# Patient Record
Sex: Female | Born: 1960 | Race: White | Hispanic: No | Marital: Married | Smoking: Current every day smoker
Health system: Southern US, Community
[De-identification: ages and names within clinical notes are randomized; demographics above are authoritative.]

## PROBLEM LIST (undated history)

## (undated) DIAGNOSIS — J449 Chronic obstructive pulmonary disease, unspecified: Secondary | ICD-10-CM

## (undated) DIAGNOSIS — I10 Essential (primary) hypertension: Secondary | ICD-10-CM

## (undated) HISTORY — PX: TUBAL LIGATION: SHX77

## (undated) HISTORY — PX: HERNIA REPAIR: SHX51

## (undated) HISTORY — PX: BREAST SURGERY: SHX581

---

## 2011-05-05 ENCOUNTER — Emergency Department: Payer: Self-pay | Admitting: Emergency Medicine

## 2012-12-24 ENCOUNTER — Emergency Department (HOSPITAL_COMMUNITY): Payer: No Typology Code available for payment source

## 2012-12-24 ENCOUNTER — Encounter (HOSPITAL_COMMUNITY): Payer: Self-pay | Admitting: *Deleted

## 2012-12-24 ENCOUNTER — Emergency Department (HOSPITAL_COMMUNITY)
Admission: EM | Admit: 2012-12-24 | Discharge: 2012-12-24 | Disposition: A | Discharge status: Home / Self Care | Payer: No Typology Code available for payment source | Attending: Emergency Medicine | Admitting: Emergency Medicine

## 2012-12-24 DIAGNOSIS — S39012A Strain of muscle, fascia and tendon of lower back, initial encounter: Secondary | ICD-10-CM

## 2012-12-24 DIAGNOSIS — S161XXA Strain of muscle, fascia and tendon at neck level, initial encounter: Secondary | ICD-10-CM

## 2012-12-24 DIAGNOSIS — Y92410 Unspecified street and highway as the place of occurrence of the external cause: Secondary | ICD-10-CM | POA: Insufficient documentation

## 2012-12-24 DIAGNOSIS — Z9851 Tubal ligation status: Secondary | ICD-10-CM | POA: Insufficient documentation

## 2012-12-24 DIAGNOSIS — S139XXA Sprain of joints and ligaments of unspecified parts of neck, initial encounter: Secondary | ICD-10-CM | POA: Insufficient documentation

## 2012-12-24 DIAGNOSIS — F17200 Nicotine dependence, unspecified, uncomplicated: Secondary | ICD-10-CM | POA: Insufficient documentation

## 2012-12-24 DIAGNOSIS — Y9389 Activity, other specified: Secondary | ICD-10-CM | POA: Insufficient documentation

## 2012-12-24 DIAGNOSIS — S335XXA Sprain of ligaments of lumbar spine, initial encounter: Secondary | ICD-10-CM | POA: Insufficient documentation

## 2012-12-24 DIAGNOSIS — Z79899 Other long term (current) drug therapy: Secondary | ICD-10-CM | POA: Insufficient documentation

## 2012-12-24 MED ORDER — HYDROCODONE-ACETAMINOPHEN 5-325 MG PO TABS: 1.0000 | ORAL_TABLET | Freq: Four times a day (QID) | ORAL | Status: AC | PRN

## 2012-12-24 MED ORDER — HYDROCODONE-ACETAMINOPHEN 5-325 MG PO TABS
1.0000 | ORAL_TABLET | Freq: Once | ORAL | Status: AC
  Administered 2012-12-24: 1 via ORAL
  Filled 2012-12-24: qty 1

## 2012-12-24 MED ORDER — CYCLOBENZAPRINE HCL 10 MG PO TABS
10.0000 mg | ORAL_TABLET | Freq: Once | ORAL | Status: AC
  Administered 2012-12-24: 10 mg via ORAL
  Filled 2012-12-24: qty 1

## 2012-12-24 MED ORDER — CYCLOBENZAPRINE HCL 10 MG PO TABS: ORAL_TABLET | ORAL | Status: DC
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
## 2012-12-25 NOTE — ED Provider Notes (Signed)
Medical screening examination/treatment/procedure(s) were performed by non-physician practitioner and as supervising physician I was immediately available for consultation/collaboration. Samuell Knoble, MD, FACEP   Thersea Manfredonia L Yesli Vanderhoff, MD 12/25/12 0028 

## 2013-01-14 ENCOUNTER — Telehealth: Payer: Self-pay | Admitting: Orthopedic Surgery
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
## 2020-06-01 ENCOUNTER — Other Ambulatory Visit: Payer: Self-pay

## 2020-06-01 ENCOUNTER — Emergency Department (HOSPITAL_COMMUNITY): Payer: Self-pay

## 2020-06-01 ENCOUNTER — Encounter (HOSPITAL_COMMUNITY): Payer: Self-pay

## 2020-06-01 ENCOUNTER — Emergency Department (HOSPITAL_COMMUNITY)
Admission: EM | Admit: 2020-06-01 | Discharge: 2020-06-01 | Disposition: A | Discharge status: Home / Self Care | Payer: Self-pay | Attending: Emergency Medicine | Admitting: Emergency Medicine

## 2020-06-01 DIAGNOSIS — N644 Mastodynia: Secondary | ICD-10-CM | POA: Insufficient documentation

## 2020-06-01 DIAGNOSIS — F17210 Nicotine dependence, cigarettes, uncomplicated: Secondary | ICD-10-CM | POA: Insufficient documentation

## 2020-06-01 MED ORDER — ACETAMINOPHEN 500 MG PO TABS
1000.0000 mg | ORAL_TABLET | Freq: Once | ORAL | Status: AC
  Administered 2020-06-01: 1000 mg via ORAL
  Filled 2020-06-01: qty 2

## 2020-06-01 MED ORDER — KETOROLAC TROMETHAMINE 60 MG/2ML IM SOLN
60.0000 mg | Freq: Once | INTRAMUSCULAR | Status: AC
  Administered 2020-06-01: 60 mg via INTRAMUSCULAR
  Filled 2020-06-01: qty 2

## 2020-06-01 MED ORDER — NAPROXEN 375 MG PO TABS: 375.0000 mg | ORAL_TABLET | Freq: Two times a day (BID) | ORAL | 0 refills | Status: DC

## 2020-06-01 NOTE — ED Triage Notes (Signed)
Pt presents to ED with complaints of right breast pain. Pt states she had augmentation surgery on that breast about 28 years ago and yesterday she felt something pop in the right breast.

## 2020-06-01 NOTE — ED Provider Notes (Signed)
[facility] Provider Note   CSN: [id number] Arrival date & time: 06/01/20  2542     History Chief Complaint  Patient presents with  . Breast Pain    Stephanie Nielsen is a 59 y.o. female.  Patient with history of breast augmentation 28 years ago, cigarette smoker presents with breast discomfort where she felt something pop in the right breast and now has pain with palpation or movement.  Patient denies shortness of breath, no blood clot risk factors, no fevers or infectious symptoms.        History reviewed. No pertinent past medical history.  There are no problems to display for this patient.   Past Surgical History:  Procedure Laterality Date  . BREAST SURGERY    . HERNIA REPAIR    . TUBAL LIGATION       OB History   No obstetric history on file.     No family history on file.  Social History   Tobacco Use  . Smoking status: Current Every Day Smoker    Types: Cigarettes  . Smokeless tobacco: Never Used  Substance Use Topics  . Alcohol use: Yes  . Drug use: No    Home Medications Prior to Admission medications   Medication Sig Start Date End Date Taking? Authorizing Provider  albuterol (PROVENTIL HFA;VENTOLIN HFA) 108 (90 BASE) MCG/ACT inhaler Inhale 2 puffs into the lungs every 6 (six) hours as needed.    [provider]  cyclobenzaprine (FLEXERIL) 10 MG tablet 1/2 to one  Tab po TID 12/24/12   Duaine Dredge, PA-C  naproxen (NAPROSYN) 375 MG tablet Take 1 tablet (375 mg total) by mouth 2 (two) times daily with a meal. 06/01/20   Elnora Morrison, MD    Allergies    Bee venom  Review of Systems   Review of Systems  Constitutional: Negative for chills and fever.  HENT: Negative for congestion.   Eyes: Negative for visual disturbance.  Respiratory: Negative for shortness of breath.   Cardiovascular: Negative for chest pain.  Gastrointestinal: Negative for abdominal pain and vomiting.  Genitourinary: Negative for dysuria  and flank pain.  Musculoskeletal: Negative for back pain, neck pain and neck stiffness.  Skin: Negative for rash.  Neurological: Negative for light-headedness and headaches.    Physical Exam Updated Vital Signs BP (!) 169/90 (BP Location: Right Arm)   Pulse 89   Resp 18   Ht 5\' 1"  (1.549 m)   Wt 59 kg   SpO2 100%   BMI 24.56 kg/m   Physical Exam Vitals and nursing note reviewed.  Constitutional:      Appearance: She is well-developed.  HENT:     Head: Normocephalic and atraumatic.  Eyes:     General:        Right eye: No discharge.        Left eye: No discharge.     Conjunctiva/sclera: Conjunctivae normal.  Neck:     Trachea: No tracheal deviation.  Cardiovascular:     Rate and Rhythm: Normal rate and regular rhythm.  Pulmonary:     Effort: Pulmonary effort is normal.     Breath sounds: Normal breath sounds.  Abdominal:     General: There is no distension.     Palpations: Abdomen is soft.     Tenderness: There is no abdominal tenderness. There is no guarding.  Musculoskeletal:     Cervical back: Normal range of motion and neck supple.  Skin:    General: Skin is  warm.     Findings: No rash.     Comments: No mass palpated right breast, tenderness base of right breast without sign of infection, reproducible tenderness, no external sign infection no drainage from right nipple.  Neurological:     Mental Status: She is alert and oriented to person, place, and time.     ED Results / Procedures / Treatments   Labs (all labs ordered are listed, but only abnormal results are displayed) Labs Reviewed - No data to display  EKG None  Radiology DG Chest 2 View  Result Date: 06/01/2020 CLINICAL DATA:  Pt with Hx of breast augmentation c/o pain in Rt chest/breast and Rt axilla after feeling a pop in Rt chest while vacuuming. Hx smoker, no COVID test ordered EXAM: CHEST - 2 VIEW COMPARISON:  Chest radiograph 10/13/2011 FINDINGS: The heart size and mediastinal contours are  within normal limits. Tiny linear opacity in the lateral right mid lung likely atelectasis or scarring. Lungs are otherwise clear. No pneumothorax or pleural effusion. The visualized skeletal structures are unremarkable. IMPRESSION: No acute cardiopulmonary finding. Electronically Signed   By: Emmaline Kluver M.D.   On: 06/01/2020 10:34    Procedures Procedures (including critical care time)  Medications Ordered in ED Medications  acetaminophen (TYLENOL) tablet 1,000 mg (1,000 mg Oral Given 06/01/20 0958)  ketorolac (TORADOL) injection 60 mg (60 mg Intramuscular Given 06/01/20 1041)    ED Course  I have reviewed the triage vital signs and the nursing notes.  Pertinent labs & imaging results that were available during my care of the patient were reviewed by me and considered in my medical decision making (see chart for details).    MDM Rules/Calculators/A&P                          Patient presents with right breast pain, female nurse in the room for assessment/exam.  Pain meds ordered and x-ray pending.  Pain meds given in the ER Tylenol and Toradol.  Exam consistent with muscular/rib, no evidence of mass or abscess however for further details patient needs outpatient mammogram/reassessment.  Discussed the importance of this with the patient. X-ray reviewed no acute fractures, no acute abnormalities.  Final Clinical Impression(s) / ED Diagnoses Final diagnoses:  Breast pain, right    Rx / DC Orders ED Discharge Orders         Ordered    naproxen (NAPROSYN) 375 MG tablet  2 times daily with meals     Discontinue  Reprint     06/01/20 1104           Blane Ohara, MD 06/01/20 1105

## 2020-06-01 NOTE — ED Notes (Signed)
Pt frustrated at diagnosis of breast pain. Pt stated "I don't get what no acute findings mean on my x-ray and why you can't tell me why I am having this sharp pain in my breast." Explained to pt that the ER may not be able to find a diagnosis but ruled out several emergency diagnoses. Instructed pt to follow up with PCP and schedule mammogram.

## 2020-06-01 NOTE — Discharge Instructions (Addendum)
Follow up with primary doctor as you will likely need ultrasound of breast/ mammogram for more details. Return for signs of infection, shortness of breath or new concerns. Take tylenol every 4 hours as needed for pain, take naproxen as needed and directed for further pain.

## 2021-02-23 IMAGING — DX DG CHEST 2V
2 series · 2 of 2 positions shown · non-contrast
Comparison: Chest radiograph 10/13/2011

CLINICAL DATA: Pt with Hx of breast augmentation c/o pain in Rt
chest/breast and Rt axilla after feeling a pop in Rt chest while
vacuuming. Hx smoker, no COVID test ordered

EXAM:
CHEST - 2 VIEW

[chest pa]
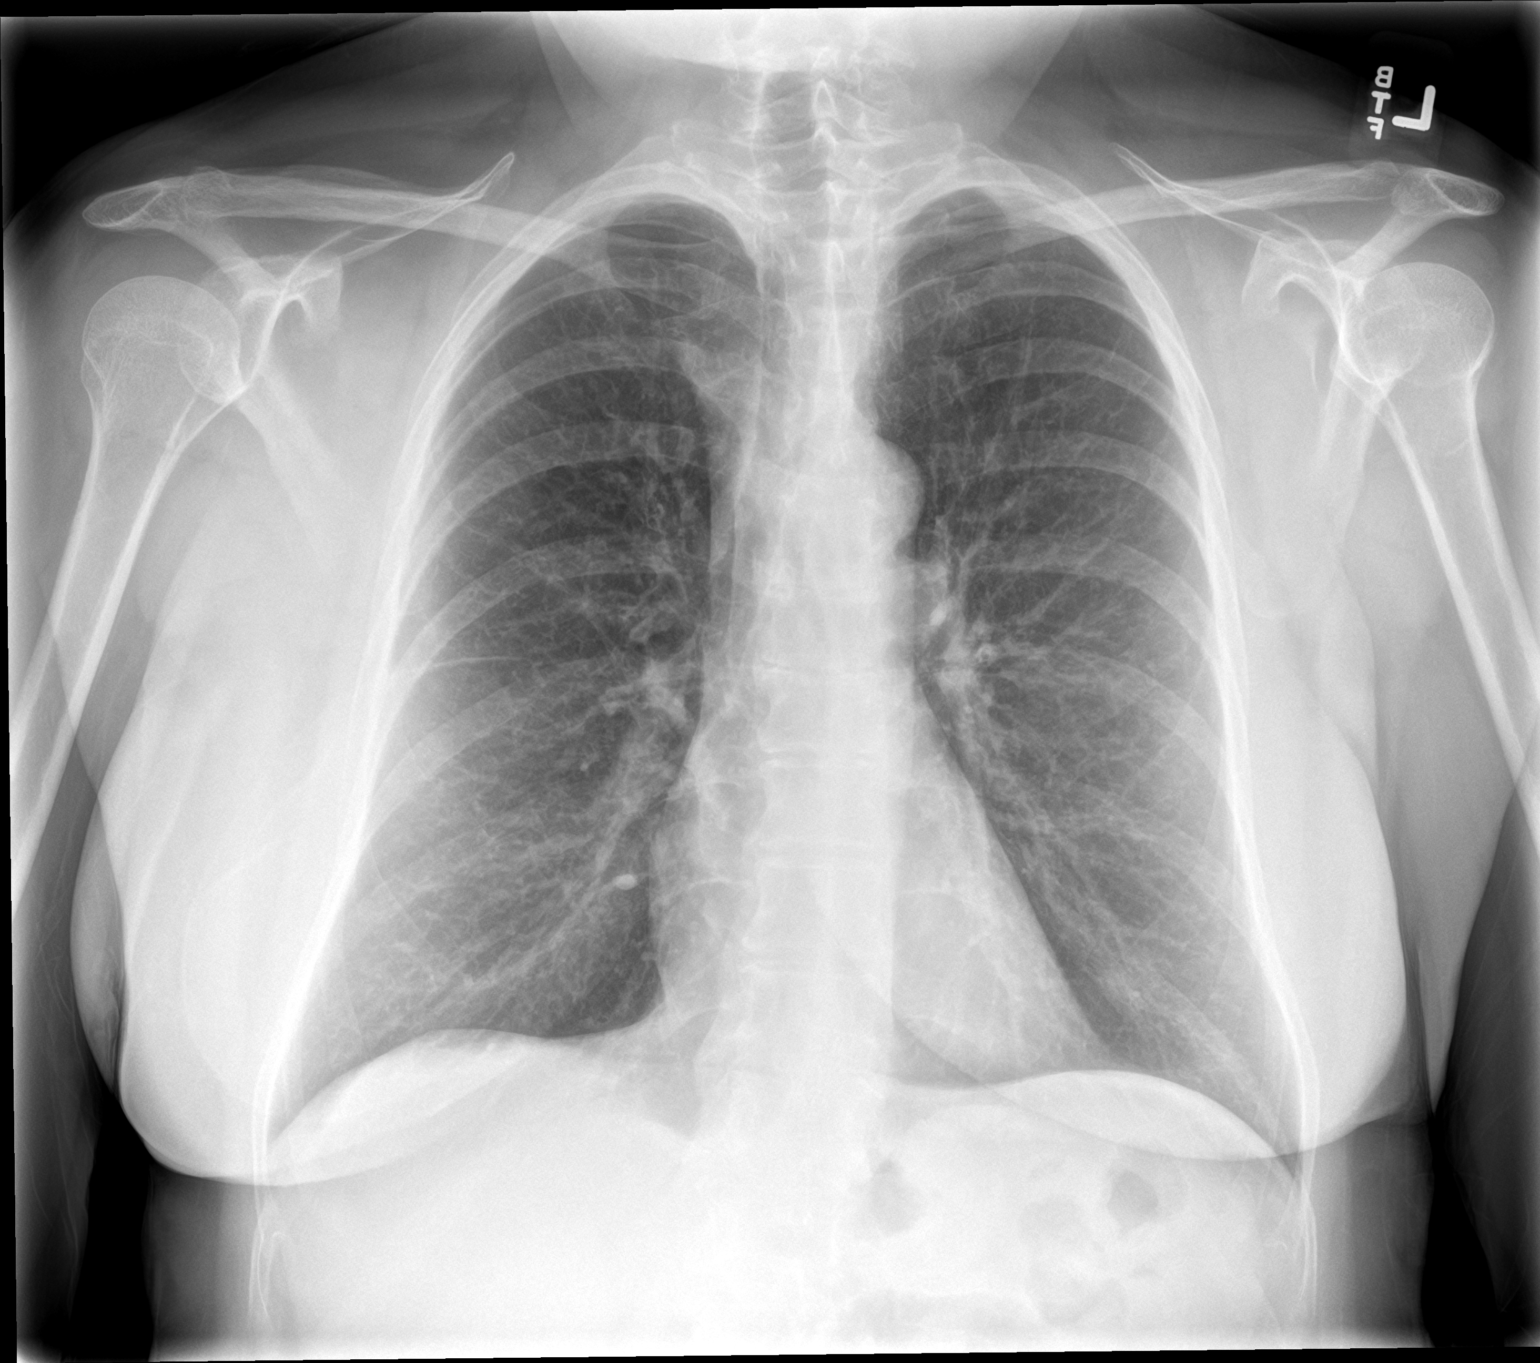

[chest lat]
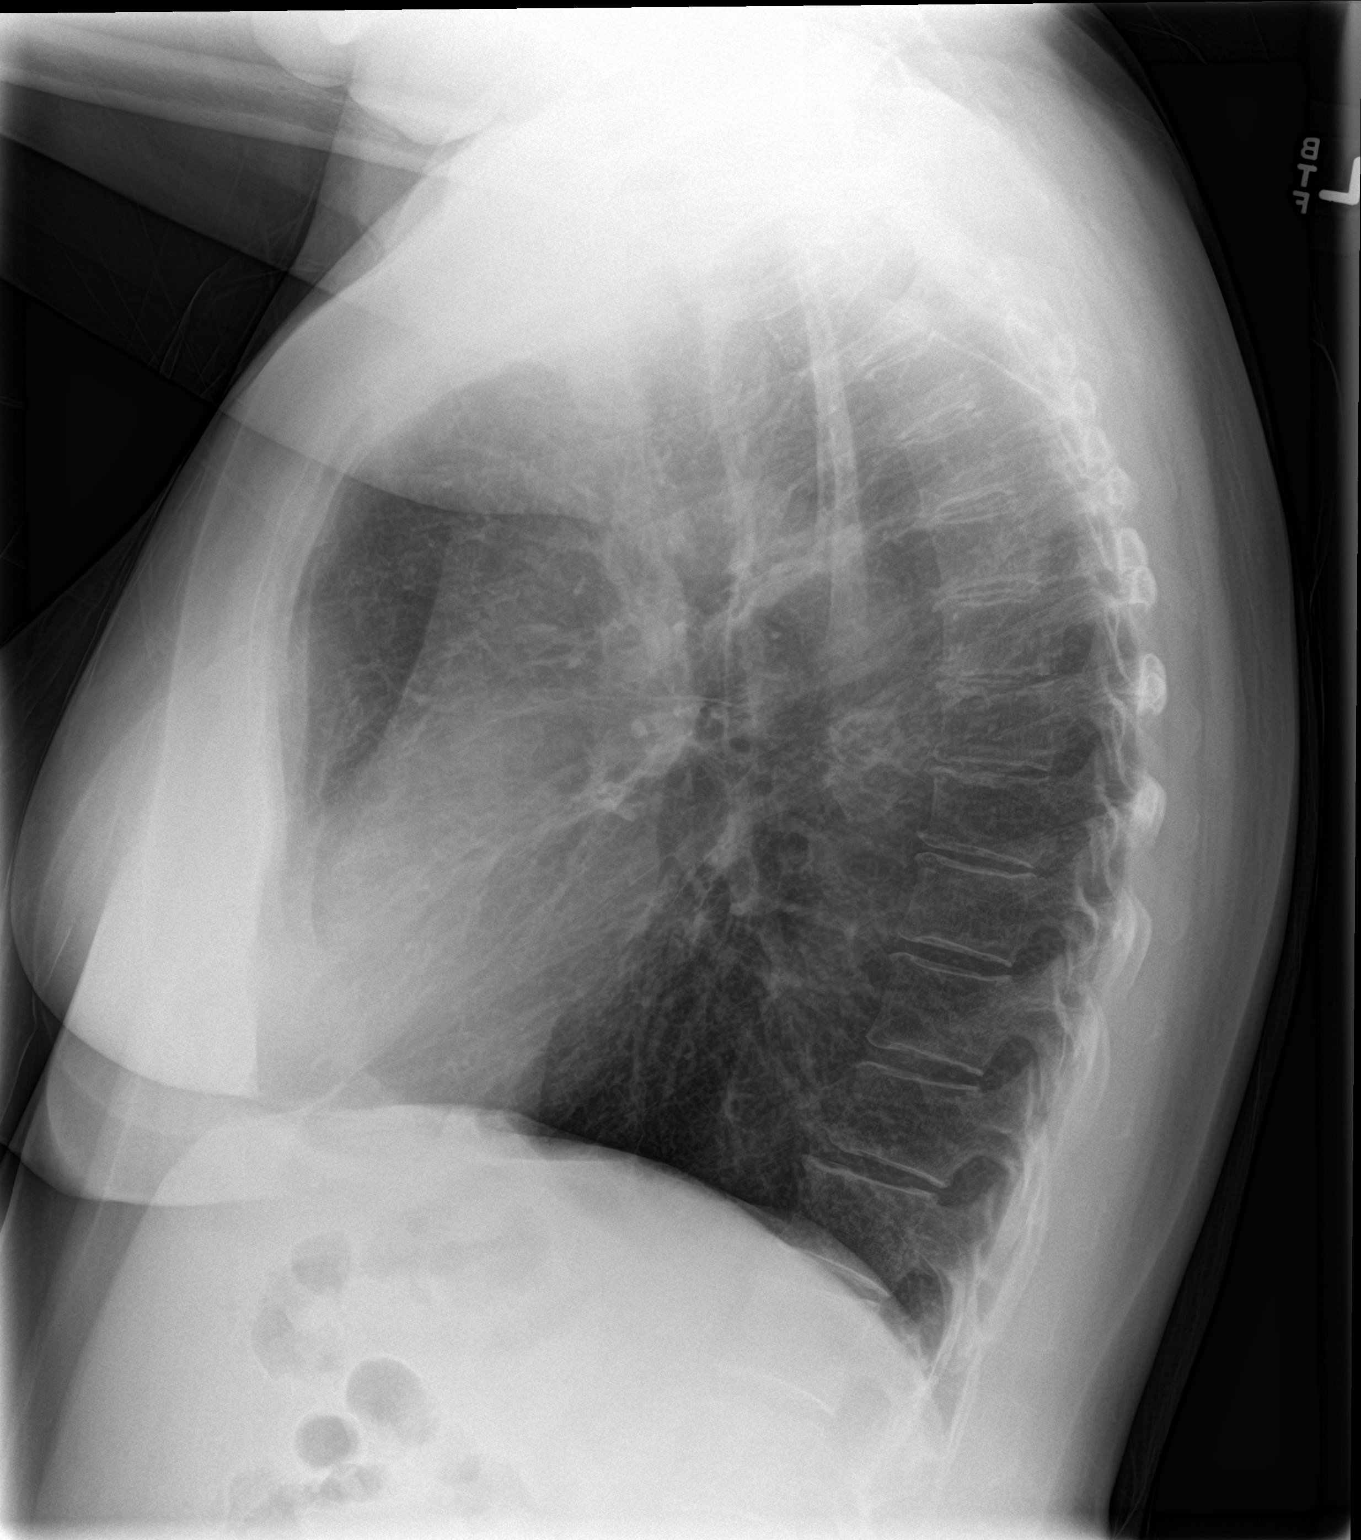

[2 of 2 positions shown; findings below may reference images not displayed]

FINDINGS: The heart size and mediastinal contours are within normal limits.
Tiny linear opacity in the lateral right mid lung likely atelectasis
or scarring. Lungs are otherwise clear. No pneumothorax or pleural
effusion. The visualized skeletal structures are unremarkable.
IMPRESSION: No acute cardiopulmonary finding.

## 2022-06-13 ENCOUNTER — Emergency Department (HOSPITAL_COMMUNITY): Payer: Self-pay

## 2022-06-13 ENCOUNTER — Emergency Department (HOSPITAL_COMMUNITY)
Admission: EM | Admit: 2022-06-13 | Discharge: 2022-06-13 | Disposition: A | Discharge status: Home / Self Care | Payer: Self-pay | Attending: Emergency Medicine | Admitting: Emergency Medicine

## 2022-06-13 ENCOUNTER — Other Ambulatory Visit: Payer: Self-pay

## 2022-06-13 ENCOUNTER — Encounter (HOSPITAL_COMMUNITY): Payer: Self-pay | Admitting: Emergency Medicine

## 2022-06-13 DIAGNOSIS — I10 Essential (primary) hypertension: Secondary | ICD-10-CM | POA: Insufficient documentation

## 2022-06-13 DIAGNOSIS — G8929 Other chronic pain: Secondary | ICD-10-CM | POA: Insufficient documentation

## 2022-06-13 DIAGNOSIS — M25511 Pain in right shoulder: Secondary | ICD-10-CM | POA: Insufficient documentation

## 2022-06-13 DIAGNOSIS — R200 Anesthesia of skin: Secondary | ICD-10-CM | POA: Insufficient documentation

## 2022-06-13 DIAGNOSIS — D72829 Elevated white blood cell count, unspecified: Secondary | ICD-10-CM | POA: Insufficient documentation

## 2022-06-13 DIAGNOSIS — M25512 Pain in left shoulder: Secondary | ICD-10-CM | POA: Insufficient documentation

## 2022-06-13 HISTORY — DX: Essential (primary) hypertension: I10

## 2022-06-13 LAB — CBC WITH DIFFERENTIAL/PLATELET
Abs Immature Granulocytes: 0.07 10*3/uL (ref 0.00–0.07)
Basophils Absolute: 0.1 10*3/uL (ref 0.0–0.1)
Basophils Relative: 1 %
Eosinophils Absolute: 0.2 10*3/uL (ref 0.0–0.5)
Eosinophils Relative: 2 %
HCT: 45.7 % (ref 36.0–46.0)
Hemoglobin: 14.7 g/dL (ref 12.0–15.0)
Immature Granulocytes: 1 %
Lymphocytes Relative: 17 %
Lymphs Abs: 2.4 10*3/uL (ref 0.7–4.0)
MCH: 30 pg (ref 26.0–34.0)
MCHC: 32.2 g/dL (ref 30.0–36.0)
MCV: 93.3 fL (ref 80.0–100.0)
Monocytes Absolute: 0.9 10*3/uL (ref 0.1–1.0)
Monocytes Relative: 6 %
Neutro Abs: 10.5 10*3/uL — ABNORMAL HIGH (ref 1.7–7.7)
Neutrophils Relative %: 73 %
Platelets: 334 10*3/uL (ref 150–400)
RBC: 4.9 MIL/uL (ref 3.87–5.11)
RDW: 14.4 % (ref 11.5–15.5)
WBC: 14.1 10*3/uL — ABNORMAL HIGH (ref 4.0–10.5)
nRBC: 0 % (ref 0.0–0.2)

## 2022-06-13 LAB — URINALYSIS, ROUTINE W REFLEX MICROSCOPIC
Bacteria, UA: NONE SEEN
Bilirubin Urine: NEGATIVE
Glucose, UA: NEGATIVE mg/dL
Ketones, ur: NEGATIVE mg/dL
Leukocytes,Ua: NEGATIVE
Nitrite: NEGATIVE
Protein, ur: NEGATIVE mg/dL
Specific Gravity, Urine: 1.016 (ref 1.005–1.030)
pH: 5 (ref 5.0–8.0)

## 2022-06-13 LAB — BASIC METABOLIC PANEL
Anion gap: 10 (ref 5–15)
BUN: 10 mg/dL (ref 6–20)
CO2: 26 mmol/L (ref 22–32)
Calcium: 9.4 mg/dL (ref 8.9–10.3)
Chloride: 100 mmol/L (ref 98–111)
Creatinine, Ser: 0.8 mg/dL (ref 0.44–1.00)
GFR, Estimated: >60 mL/min (ref >60)
Glucose, Bld: 92 mg/dL (ref 70–99)
Potassium: 4.3 mmol/L (ref 3.5–5.1)
Sodium: 136 mmol/L (ref 135–145)

## 2022-06-13 LAB — RAPID URINE DRUG SCREEN, HOSP PERFORMED
Amphetamines: NOT DETECTED
Barbiturates: NOT DETECTED
Benzodiazepines: NOT DETECTED
Cocaine: NOT DETECTED
Opiates: NOT DETECTED
Tetrahydrocannabinol: POSITIVE — AB

## 2022-06-13 LAB — PROTIME-INR
INR: 0.9 (ref 0.8–1.2)
Prothrombin Time: 12.3 seconds (ref 11.4–15.2)

## 2022-06-13 LAB — TROPONIN I (HIGH SENSITIVITY)
Troponin I (High Sensitivity): 3 ng/L (ref <18)
Troponin I (High Sensitivity): 3 ng/L (ref <18)

## 2022-06-13 MED ORDER — PREDNISONE 10 MG PO TABS: ORAL_TABLET | ORAL | 0 refills | Status: DC

## 2022-06-13 MED ORDER — PREDNISONE 50 MG PO TABS
60.0000 mg | ORAL_TABLET | Freq: Once | ORAL | Status: AC
  Administered 2022-06-13: 60 mg via ORAL
  Filled 2022-06-13: qty 1

## 2022-06-13 MED ORDER — HYDROCODONE-ACETAMINOPHEN 5-325 MG PO TABS: 1.0000 | ORAL_TABLET | Freq: Four times a day (QID) | ORAL | 0 refills | Status: DC | PRN

## 2022-06-13 MED ORDER — HYDROCODONE-ACETAMINOPHEN 5-325 MG PO TABS
1.0000 | ORAL_TABLET | Freq: Once | ORAL | Status: AC
  Administered 2022-06-13: 1 via ORAL
  Filled 2022-06-13: qty 1

## 2022-06-13 NOTE — ED Provider Notes (Signed)
[facility]Y [facility]MENT Provider Note   CSN: [id number] Arrival date & time: 06/13/22  0931     History  Chief Complaint  Patient presents with   Hand Problem    Stephanie Nielsen is a 61 y.o. female with history of hypertension, surgical history significant for right carpal tunnel surgery presenting for evaluation of intermittent episodes of numbness.  She describes chronic intermittent numbness predominantly in her left fourth and fifth fingers and lateral hand which has been present for more than 6 months but has been more frequent over the past several weeks.  She also describes having an episode 2 days ago when she was eating breakfast where the left side of her face went numb from her cheek through her chin, lasted for about an hour and then resolved.  She denies any focal weakness, dizziness, headache, neck pain or stiffness, difficulty with speech.  She currently feels slight tingling sensation in her left fourth and fifth fingers, not as severe as is typical.  She frequently wakes with a sensation in her left hand.  She has found no alleviators for this symptom, stating it occurs randomly.  The history is provided by the patient.       Home Medications Prior to Admission medications   Medication Sig Start Date End Date Taking? Authorizing Provider  HYDROcodone-acetaminophen (NORCO/VICODIN) 5-325 MG tablet Take 1 tablet by mouth every 6 (six) hours as needed for severe pain. 06/13/22  Yes Rishawn Walck, Raynelle FanningJulie, PA-C  predniSONE (DELTASONE) 10 MG tablet 6, 5, 4, 3, 2 then 1 tablet by mouth daily for 6 days total. 06/13/22  Yes Lanice Folden, Raynelle FanningJulie, PA-C  albuterol (PROVENTIL HFA;VENTOLIN HFA) 108 (90 BASE) MCG/ACT inhaler Inhale 2 puffs into the lungs every 6 (six) hours as needed.    [provider]  cyclobenzaprine (FLEXERIL) 10 MG tablet 1/2 to one  Tab po TID 12/24/12   Worthy RancherMiller, Richard M, PA-C  naproxen (NAPROSYN) 375 MG tablet Take 1 tablet (375 mg total) by mouth 2 (two) times daily  with a meal. 06/01/20   Blane OharaZavitz, Joshua, MD      Allergies    Bee venom    Review of Systems   Review of Systems  Constitutional:  Negative for chills and fever.  HENT: Negative.    Eyes: Negative.   Respiratory:  Negative for chest tightness and shortness of breath.   Cardiovascular: Negative.   Gastrointestinal: Negative.   Genitourinary: Negative.   Musculoskeletal:  Negative for arthralgias, joint swelling, myalgias and neck pain.  Skin: Negative.  Negative for rash and wound.  Neurological:  Positive for numbness. Negative for dizziness, facial asymmetry, speech difficulty, weakness, light-headedness and headaches.  Psychiatric/Behavioral: Negative.      Physical Exam Updated Vital Signs BP (!) 166/92   Pulse 67   Temp 98.3 F (36.8 C) (Oral)   Resp 14   Ht 5\' 1"  (1.549 m)   Wt 59 kg   SpO2 98%   BMI 24.58 kg/m  Physical Exam Vitals and nursing note reviewed.  Constitutional:      Appearance: She is well-developed.  HENT:     Head: Normocephalic and atraumatic.  Eyes:     General: No visual field deficit.    Conjunctiva/sclera: Conjunctivae normal.  Cardiovascular:     Rate and Rhythm: Normal rate and regular rhythm.     Heart sounds: Normal heart sounds.  Pulmonary:     Effort: Pulmonary effort is normal.     Breath sounds: Normal breath sounds. No  wheezing.  Abdominal:     General: Bowel sounds are normal.     Palpations: Abdomen is soft.     Tenderness: There is no abdominal tenderness.  Musculoskeletal:        General: Normal range of motion.     Cervical back: Normal range of motion.     Comments: Positive Tinel's left.  Patient endorses tingling in her left ring, fifth and less involved long finger.  Skin:    General: Skin is warm and dry.  Neurological:     General: No focal deficit present.     Mental Status: She is alert.     Cranial Nerves: Cranial nerves 2-12 are intact. No dysarthria or facial asymmetry.     Sensory: Sensory deficit  present.     Motor: Motor function is intact.     Coordination: Coordination is intact. Coordination normal. Rapid alternating movements normal.     Gait: Gait normal.     Deep Tendon Reflexes: Reflexes are normal and symmetric.     Comments: Left fingers, ulnar distribution.  Negative pronator drift     ED Results / Procedures / Treatments   Labs (all labs ordered are listed, but only abnormal results are displayed) Labs Reviewed  CBC WITH DIFFERENTIAL/PLATELET - Abnormal; Notable for the following components:      Result Value   WBC 14.1 (*)    Neutro Abs 10.5 (*)    All other components within normal limits  RAPID URINE DRUG SCREEN, HOSP PERFORMED - Abnormal; Notable for the following components:   Tetrahydrocannabinol POSITIVE (*)    All other components within normal limits  URINALYSIS, ROUTINE W REFLEX MICROSCOPIC - Abnormal; Notable for the following components:   Hgb urine dipstick MODERATE (*)    All other components within normal limits  BASIC METABOLIC PANEL  PROTIME-INR  TROPONIN I (HIGH SENSITIVITY)  TROPONIN I (HIGH SENSITIVITY)    EKG None  Radiology MR BRAIN WO CONTRAST  Result Date: 06/13/2022 CLINICAL DATA:  Provided history: Transient ischemic attack (TIA). Intermittent left face and left hand numbness. EXAM: MRI HEAD WITHOUT CONTRAST MRI CERVICAL SPINE WITHOUT CONTRAST TECHNIQUE: Multiplanar, multiecho pulse sequences of the brain and surrounding structures, and cervical spine, to include the craniocervical junction and cervicothoracic junction, were obtained without intravenous contrast. COMPARISON:  Cervical spine CT 12/21/2012. FINDINGS: MRI HEAD FINDINGS Mild intermittent motion degradation. Brain: Cerebral volume is normal. No cortical encephalomalacia is identified. No significant cerebral white matter disease for age. There is no acute infarct. No evidence of an intracranial mass. No chronic intracranial blood products. No extra-axial fluid collection.  No midline shift. Vascular: Maintained flow voids within the proximal large arterial vessels. Skull and upper cervical spine: No focal suspicious marrow lesion. Sinuses/Orbits: No mass or acute finding within the imaged orbits. Mild mucosal thickening along the floor of the left maxillary sinus. Asymmetric soft tissue prominence along the left middle nasal turbinate, which may reflect turbinate hypertrophy or a polyp (for instance as seen on series 10, image 3). Other: Small volume fluid within the bilateral mastoid air cells. MRI CERVICAL SPINE FINDINGS Mild intermittent motion degradation Alignment: Straightening of the expected cervical lordosis. Trace C2-C3 grade 1 anterolisthesis. Trace C5-C6 grade 1 retrolisthesis. Vertebrae: Vertebral body height is maintained. Trace degenerative endplate edema at I4-P8. No significant marrow edema or focal suspicious osseous lesion identified elsewhere. Cord: No signal abnormality identified within the cervical spinal cord. Posterior Fossa, vertebral arteries, paraspinal tissues: Posterior fossa better assessed on concurrently performed brain MRI.  Flow voids preserved within the imaged cervical vertebral arteries. No paraspinal mass or collection. Disc levels: Mild-to-moderate disc degeneration at C5-C6. No more than mild disc degeneration at the remaining levels. C2-C3: Trace grade 1 anterolisthesis. Facet arthrosis on the left. No significant spinal canal stenosis. Mild left neural foraminal narrowing. C3-C4: Shallow disc bulge. Uncovertebral hypertrophy on the right. No significant spinal canal stenosis. Mild relative right neural foraminal narrowing. C4-C5: Disc bulge with endplate spurring. Uncovertebral hypertrophy on the right. Facet arthrosis on the left. No significant spinal canal stenosis. Bilateral neural foraminal narrowing (mild right, mild to moderate left). C5-C6: Posterior disc osteophyte complex with right-sided disc osteophyte ridge/uncinate hypertrophy.  Uncovertebral hypertrophy also present on the left. Mild facet arthrosis. Mild relative spinal canal narrowing (without spinal cord mass effect). Bilateral neural foraminal narrowing (severe right, moderate left). C6-C7: Shallow disc bulge. Uncovertebral hypertrophy on the right. Mild facet arthrosis. Mild relative spinal canal narrowing (without spinal cord mass effect). Bilateral neural foraminal narrowing (severe right, moderate left). C7-T1: Mild facet arthrosis. No significant disc herniation or stenosis. IMPRESSION: MRI brain: 1. Unremarkable non-contrast MRI appearance of the brain for age. No evidence of acute intracranial abnormality. 2. Mild left maxillary sinus mucosal thickening. 3. Asymmetric soft tissue prominence along the left middle nasal turbinate, which may reflect turbinate hypertrophy or a polyp. Direct visualization recommended. 4. Small-volume fluid within the bilateral mastoid air cells. MRI brain: 1. Mildly motion degraded exam. 2. Cervical spondylosis, as outlined. No more than mild relative spinal canal narrowing. Multilevel foraminal stenosis, as detailed and greatest on the left at C4-C5 (mild-to-moderate), bilaterally at C5-C6 (severe right, moderate left) and bilaterally at C6-C7 (severe right, moderate left). Disc degeneration is greatest at C5-C6 (moderate at this level with associated trace degenerative endplate edema). 3. Nonspecific straightening of the expected cervical lordosis. Electronically Signed   By: Jackey Loge D.O.   On: 06/13/2022 12:14   MR Cervical Spine Wo Contrast  Result Date: 06/13/2022 CLINICAL DATA:  Provided history: Transient ischemic attack (TIA). Intermittent left face and left hand numbness. EXAM: MRI HEAD WITHOUT CONTRAST MRI CERVICAL SPINE WITHOUT CONTRAST TECHNIQUE: Multiplanar, multiecho pulse sequences of the brain and surrounding structures, and cervical spine, to include the craniocervical junction and cervicothoracic junction, were obtained  without intravenous contrast. COMPARISON:  Cervical spine CT 12/21/2012. FINDINGS: MRI HEAD FINDINGS Mild intermittent motion degradation. Brain: Cerebral volume is normal. No cortical encephalomalacia is identified. No significant cerebral white matter disease for age. There is no acute infarct. No evidence of an intracranial mass. No chronic intracranial blood products. No extra-axial fluid collection. No midline shift. Vascular: Maintained flow voids within the proximal large arterial vessels. Skull and upper cervical spine: No focal suspicious marrow lesion. Sinuses/Orbits: No mass or acute finding within the imaged orbits. Mild mucosal thickening along the floor of the left maxillary sinus. Asymmetric soft tissue prominence along the left middle nasal turbinate, which may reflect turbinate hypertrophy or a polyp (for instance as seen on series 10, image 3). Other: Small volume fluid within the bilateral mastoid air cells. MRI CERVICAL SPINE FINDINGS Mild intermittent motion degradation Alignment: Straightening of the expected cervical lordosis. Trace C2-C3 grade 1 anterolisthesis. Trace C5-C6 grade 1 retrolisthesis. Vertebrae: Vertebral body height is maintained. Trace degenerative endplate edema at K3-T4. No significant marrow edema or focal suspicious osseous lesion identified elsewhere. Cord: No signal abnormality identified within the cervical spinal cord. Posterior Fossa, vertebral arteries, paraspinal tissues: Posterior fossa better assessed on concurrently performed brain MRI. Flow voids preserved within  the imaged cervical vertebral arteries. No paraspinal mass or collection. Disc levels: Mild-to-moderate disc degeneration at C5-C6. No more than mild disc degeneration at the remaining levels. C2-C3: Trace grade 1 anterolisthesis. Facet arthrosis on the left. No significant spinal canal stenosis. Mild left neural foraminal narrowing. C3-C4: Shallow disc bulge. Uncovertebral hypertrophy on the right. No  significant spinal canal stenosis. Mild relative right neural foraminal narrowing. C4-C5: Disc bulge with endplate spurring. Uncovertebral hypertrophy on the right. Facet arthrosis on the left. No significant spinal canal stenosis. Bilateral neural foraminal narrowing (mild right, mild to moderate left). C5-C6: Posterior disc osteophyte complex with right-sided disc osteophyte ridge/uncinate hypertrophy. Uncovertebral hypertrophy also present on the left. Mild facet arthrosis. Mild relative spinal canal narrowing (without spinal cord mass effect). Bilateral neural foraminal narrowing (severe right, moderate left). C6-C7: Shallow disc bulge. Uncovertebral hypertrophy on the right. Mild facet arthrosis. Mild relative spinal canal narrowing (without spinal cord mass effect). Bilateral neural foraminal narrowing (severe right, moderate left). C7-T1: Mild facet arthrosis. No significant disc herniation or stenosis. IMPRESSION: MRI brain: 1. Unremarkable non-contrast MRI appearance of the brain for age. No evidence of acute intracranial abnormality. 2. Mild left maxillary sinus mucosal thickening. 3. Asymmetric soft tissue prominence along the left middle nasal turbinate, which may reflect turbinate hypertrophy or a polyp. Direct visualization recommended. 4. Small-volume fluid within the bilateral mastoid air cells. MRI brain: 1. Mildly motion degraded exam. 2. Cervical spondylosis, as outlined. No more than mild relative spinal canal narrowing. Multilevel foraminal stenosis, as detailed and greatest on the left at C4-C5 (mild-to-moderate), bilaterally at C5-C6 (severe right, moderate left) and bilaterally at C6-C7 (severe right, moderate left). Disc degeneration is greatest at C5-C6 (moderate at this level with associated trace degenerative endplate edema). 3. Nonspecific straightening of the expected cervical lordosis. Electronically Signed   By: Jackey Loge D.O.   On: 06/13/2022 12:14    Procedures Procedures     Medications Ordered in ED Medications  predniSONE (DELTASONE) tablet 60 mg (60 mg Oral Given 06/13/22 1735)  HYDROcodone-acetaminophen (NORCO/VICODIN) 5-325 MG per tablet 1 tablet (1 tablet Oral Given 06/13/22 1736)    ED Course/ Medical Decision Making/ A&P                           Medical Decision Making Patient presenting with chronic intermittent left lateral hand numbness/tingling with 1 transient episode of left facial numbness occurring several days ago and now resolved.  Initially the concern for TIA was raised, however the chronic hand symptoms are more suggestive of peripheral neuropathy/carpal tunnel versus a cervical source of symptoms.  We obtained MRI imaging of brain and neck to rule out CVA.  This testing was negative for CVA or obvious vascular compromise.  She does have significant foraminal stenosis greatest at the left C4-C5 along with DDD at C5-C6.  She does have a positive Tinel's sign however, I feel she would benefit from neurologic evaluation, nerve conduction studies may help determine the actual source of the chronic hand numbness.  She was referred to Dr. Gerilyn Pilgrim for this.  Amount and/or Complexity of Data Reviewed Labs: ordered.    Details: Labs reviewed, she does have an elevated WBC count, there is no history or exam findings to suggest an infectious source for this elevation. Radiology: ordered.    Details: MR with significant results mentioned above.  Risk Prescription drug management.           Final Clinical Impression(s) / ED  Diagnoses Final diagnoses:  Numbness and tingling of left hand  Chronic pain of both shoulders    Rx / DC Orders ED Discharge Orders          Ordered    predniSONE (DELTASONE) 10 MG tablet        06/13/22 1709    HYDROcodone-acetaminophen (NORCO/VICODIN) 5-325 MG tablet  Every 6 hours PRN        06/13/22 1710              Burgess Amor, PA-C 06/15/22 1149    Bethann Berkshire, MD 06/15/22 1752

## 2022-06-13 NOTE — ED Triage Notes (Signed)
Pt to the ED with complaints of left hand numbness that is intermittent, Pt states her face went numb 2 days ago, but has since subsided.  Pt states she just does not feel right.

## 2022-06-13 NOTE — Discharge Instructions (Addendum)
As discussed, you do have some degenerative changes in your cervical spine which could potentially explain the intermittent numbness in your left hand, although the distribution of the numbness in your hand also suggest possible carpal tunnel syndrome in this hand as well.  You would benefit from further testing by a neurologist, specifically nerve conduction studies to help localize the exact source and best treatment plan for your symptoms.  You have been prescribed prednisone to help with your immediate symptoms, this can help reduce inflammation and pain.  If needed you can take the hydrocodone for additional pain relief, however do not drive within 4 hours of taking this medication as it will make you drowsy.  Of note, this is not the reason for today's visit, but your MRI suggest that you may have a nasal polyp which would benefit from further evaluation, especially if you have chronic nasal congestion or frequent sinus infections.

## 2023-10-18 ENCOUNTER — Encounter (HOSPITAL_COMMUNITY): Payer: Self-pay

## 2023-10-18 ENCOUNTER — Inpatient Hospital Stay (HOSPITAL_COMMUNITY)
Admission: EM | Admit: 2023-10-18 | Discharge: 2023-10-20 | DRG: 190 | Disposition: A | Discharge status: Home / Self Care | Payer: Self-pay | Attending: Family Medicine | Admitting: Family Medicine

## 2023-10-18 ENCOUNTER — Emergency Department (HOSPITAL_COMMUNITY): Payer: Self-pay

## 2023-10-18 ENCOUNTER — Other Ambulatory Visit: Payer: Self-pay

## 2023-10-18 ENCOUNTER — Observation Stay (HOSPITAL_COMMUNITY): Payer: Self-pay

## 2023-10-18 DIAGNOSIS — Z1152 Encounter for screening for COVID-19: Secondary | ICD-10-CM

## 2023-10-18 DIAGNOSIS — J441 Chronic obstructive pulmonary disease with (acute) exacerbation: Principal | ICD-10-CM | POA: Diagnosis present

## 2023-10-18 DIAGNOSIS — J9601 Acute respiratory failure with hypoxia: Secondary | ICD-10-CM | POA: Diagnosis present

## 2023-10-18 DIAGNOSIS — Z720 Tobacco use: Secondary | ICD-10-CM | POA: Diagnosis present

## 2023-10-18 DIAGNOSIS — Z91030 Bee allergy status: Secondary | ICD-10-CM

## 2023-10-18 DIAGNOSIS — R0789 Other chest pain: Secondary | ICD-10-CM | POA: Diagnosis present

## 2023-10-18 DIAGNOSIS — F17210 Nicotine dependence, cigarettes, uncomplicated: Secondary | ICD-10-CM | POA: Diagnosis present

## 2023-10-18 DIAGNOSIS — I10 Essential (primary) hypertension: Secondary | ICD-10-CM | POA: Diagnosis present

## 2023-10-18 DIAGNOSIS — Z716 Tobacco abuse counseling: Secondary | ICD-10-CM

## 2023-10-18 LAB — CBC WITH DIFFERENTIAL/PLATELET
Abs Immature Granulocytes: 0.06 10*3/uL (ref 0.00–0.07)
Basophils Absolute: 0.1 10*3/uL (ref 0.0–0.1)
Basophils Relative: 1 %
Eosinophils Absolute: 1.4 10*3/uL — ABNORMAL HIGH (ref 0.0–0.5)
Eosinophils Relative: 11 %
HCT: 42.6 % (ref 36.0–46.0)
Hemoglobin: 14 g/dL (ref 12.0–15.0)
Immature Granulocytes: 1 %
Lymphocytes Relative: 16 %
Lymphs Abs: 2.1 10*3/uL (ref 0.7–4.0)
MCH: 29.8 pg (ref 26.0–34.0)
MCHC: 32.9 g/dL (ref 30.0–36.0)
MCV: 90.6 fL (ref 80.0–100.0)
Monocytes Absolute: 1 10*3/uL (ref 0.1–1.0)
Monocytes Relative: 7 %
Neutro Abs: 8.3 10*3/uL — ABNORMAL HIGH (ref 1.7–7.7)
Neutrophils Relative %: 64 %
Platelets: 370 10*3/uL (ref 150–400)
RBC: 4.7 MIL/uL (ref 3.87–5.11)
RDW: 14.4 % (ref 11.5–15.5)
WBC: 12.9 10*3/uL — ABNORMAL HIGH (ref 4.0–10.5)
nRBC: 0 % (ref 0.0–0.2)

## 2023-10-18 LAB — COMPREHENSIVE METABOLIC PANEL
ALT: 19 U/L (ref 0–44)
AST: 20 U/L (ref 15–41)
Albumin: 4 g/dL (ref 3.5–5.0)
Alkaline Phosphatase: 71 U/L (ref 38–126)
Anion gap: 11 (ref 5–15)
BUN: 10 mg/dL (ref 6–20)
CO2: 23 mmol/L (ref 22–32)
Calcium: 9.2 mg/dL (ref 8.9–10.3)
Chloride: 102 mmol/L (ref 98–111)
Creatinine, Ser: 0.76 mg/dL (ref 0.44–1.00)
GFR, Estimated: >60 mL/min (ref >60)
Glucose, Bld: 130 mg/dL — ABNORMAL HIGH (ref 70–99)
Potassium: 3.5 mmol/L (ref 3.5–5.1)
Sodium: 136 mmol/L (ref 135–145)
Total Bilirubin: 0.5 mg/dL (ref 0.3–1.2)
Total Protein: 7.5 g/dL (ref 6.5–8.1)

## 2023-10-18 LAB — RAPID URINE DRUG SCREEN, HOSP PERFORMED
Amphetamines: NOT DETECTED
Barbiturates: NOT DETECTED
Benzodiazepines: NOT DETECTED
Cocaine: NOT DETECTED
Opiates: POSITIVE — AB
Tetrahydrocannabinol: POSITIVE — AB

## 2023-10-18 LAB — URINALYSIS, ROUTINE W REFLEX MICROSCOPIC
Bacteria, UA: NONE SEEN
Glucose, UA: NEGATIVE mg/dL
Ketones, ur: 5 mg/dL — AB
Leukocytes,Ua: NEGATIVE
Nitrite: NEGATIVE
Protein, ur: 30 mg/dL — AB
Specific Gravity, Urine: 1.03 (ref 1.005–1.030)
pH: 5 (ref 5.0–8.0)

## 2023-10-18 LAB — SARS CORONAVIRUS 2 BY RT PCR: SARS Coronavirus 2 by RT PCR: NEGATIVE

## 2023-10-18 LAB — TROPONIN I (HIGH SENSITIVITY)
Troponin I (High Sensitivity): 3 ng/L (ref <18)
Troponin I (High Sensitivity): 3 ng/L (ref <18)

## 2023-10-18 LAB — BRAIN NATRIURETIC PEPTIDE: B Natriuretic Peptide: 20 pg/mL (ref 0.0–100.0)

## 2023-10-18 LAB — D-DIMER, QUANTITATIVE: D-Dimer, Quant: 0.29 ug{FEU}/mL (ref 0.00–0.50)

## 2023-10-18 MED ORDER — IPRATROPIUM-ALBUTEROL 0.5-2.5 (3) MG/3ML IN SOLN
3.0000 mL | Freq: Once | RESPIRATORY_TRACT | Status: AC
  Administered 2023-10-20: 3 mL via RESPIRATORY_TRACT
  Filled 2023-10-18: qty 3

## 2023-10-18 MED ORDER — METHYLPREDNISOLONE SODIUM SUCC 125 MG IJ SOLR
125.0000 mg | Freq: Once | INTRAMUSCULAR | Status: AC
Start: 2023-10-18 — End: 2023-10-18
  Administered 2023-10-18: 125 mg via INTRAVENOUS
  Filled 2023-10-18: qty 2

## 2023-10-18 MED ORDER — IPRATROPIUM-ALBUTEROL 0.5-2.5 (3) MG/3ML IN SOLN
3.0000 mL | Freq: Four times a day (QID) | RESPIRATORY_TRACT | Status: DC
  Administered 2023-10-18 – 2023-10-20 (×8): 3 mL via RESPIRATORY_TRACT
  Filled 2023-10-18 (×9): qty 3

## 2023-10-18 MED ORDER — HYDROCOD POLI-CHLORPHE POLI ER 10-8 MG/5ML PO SUER
5.0000 mL | Freq: Two times a day (BID) | ORAL | Status: DC | PRN
  Administered 2023-10-19 – 2023-10-20 (×3): 5 mL via ORAL
  Filled 2023-10-18 (×3): qty 5

## 2023-10-18 MED ORDER — POLYETHYLENE GLYCOL 3350 17 G PO PACK: 17.0000 g | PACK | Freq: Every day | ORAL | Status: DC | PRN

## 2023-10-18 MED ORDER — GUAIFENESIN ER 600 MG PO TB12
600.0000 mg | ORAL_TABLET | Freq: Two times a day (BID) | ORAL | Status: DC
  Administered 2023-10-18: 600 mg via ORAL
  Filled 2023-10-18: qty 1

## 2023-10-18 MED ORDER — ONDANSETRON HCL 4 MG PO TABS
4.0000 mg | ORAL_TABLET | Freq: Four times a day (QID) | ORAL | Status: DC | PRN
  Administered 2023-10-18: 4 mg via ORAL
  Filled 2023-10-18: qty 1

## 2023-10-18 MED ORDER — SODIUM CHLORIDE 0.9% FLUSH: 3.0000 mL | INTRAVENOUS | Status: DC | PRN

## 2023-10-18 MED ORDER — GUAIFENESIN-DM 100-10 MG/5ML PO SYRP
5.0000 mL | ORAL_SOLUTION | ORAL | Status: DC | PRN
  Administered 2023-10-18 – 2023-10-20 (×6): 5 mL via ORAL
  Filled 2023-10-18 (×6): qty 5

## 2023-10-18 MED ORDER — SODIUM CHLORIDE 0.9% FLUSH
3.0000 mL | Freq: Two times a day (BID) | INTRAVENOUS | Status: DC
  Administered 2023-10-18 – 2023-10-19 (×3): 3 mL via INTRAVENOUS

## 2023-10-18 MED ORDER — HYDROCODONE-ACETAMINOPHEN 5-325 MG PO TABS
1.0000 | ORAL_TABLET | Freq: Four times a day (QID) | ORAL | Status: DC | PRN
  Administered 2023-10-18 – 2023-10-20 (×8): 1 via ORAL
  Filled 2023-10-18 (×8): qty 1

## 2023-10-18 MED ORDER — ALBUTEROL SULFATE (2.5 MG/3ML) 0.083% IN NEBU
2.5000 mg | INHALATION_SOLUTION | RESPIRATORY_TRACT | Status: DC | PRN
  Administered 2023-10-19 – 2023-10-20 (×3): 2.5 mg via RESPIRATORY_TRACT
  Filled 2023-10-18 (×4): qty 3

## 2023-10-18 MED ORDER — IOHEXOL 350 MG/ML SOLN
75.0000 mL | Freq: Once | INTRAVENOUS | Status: AC | PRN
  Administered 2023-10-18: 75 mL via INTRAVENOUS

## 2023-10-18 MED ORDER — ACETAMINOPHEN 650 MG RE SUPP: 650.0000 mg | Freq: Four times a day (QID) | RECTAL | Status: DC | PRN

## 2023-10-18 MED ORDER — METHYLPREDNISOLONE SODIUM SUCC 40 MG IJ SOLR
40.0000 mg | Freq: Two times a day (BID) | INTRAMUSCULAR | Status: DC
  Administered 2023-10-18 – 2023-10-20 (×4): 40 mg via INTRAVENOUS
  Filled 2023-10-18 (×4): qty 1

## 2023-10-18 MED ORDER — SODIUM CHLORIDE 0.9 % IV SOLN
1.0000 g | Freq: Once | INTRAVENOUS | Status: AC
  Administered 2023-10-18: 1 g via INTRAVENOUS
  Filled 2023-10-18: qty 10

## 2023-10-18 MED ORDER — DOXYCYCLINE HYCLATE 100 MG PO TABS
100.0000 mg | ORAL_TABLET | Freq: Two times a day (BID) | ORAL | Status: DC
  Administered 2023-10-19 – 2023-10-20 (×3): 100 mg via ORAL
  Filled 2023-10-18 (×3): qty 1

## 2023-10-18 MED ORDER — ONDANSETRON HCL 4 MG/2ML IJ SOLN
4.0000 mg | Freq: Four times a day (QID) | INTRAMUSCULAR | Status: DC | PRN
  Administered 2023-10-18 – 2023-10-20 (×3): 4 mg via INTRAVENOUS
  Filled 2023-10-18 (×2): qty 2

## 2023-10-18 MED ORDER — DM-GUAIFENESIN ER 30-600 MG PO TB12
1.0000 | ORAL_TABLET | Freq: Two times a day (BID) | ORAL | Status: DC
  Administered 2023-10-18 – 2023-10-20 (×4): 1 via ORAL
  Filled 2023-10-18 (×4): qty 1

## 2023-10-18 MED ORDER — HEPARIN SODIUM (PORCINE) 5000 UNIT/ML IJ SOLN
5000.0000 [IU] | Freq: Three times a day (TID) | INTRAMUSCULAR | Status: DC
  Administered 2023-10-18 – 2023-10-20 (×5): 5000 [IU] via SUBCUTANEOUS
  Filled 2023-10-18 (×6): qty 1

## 2023-10-18 MED ORDER — BISACODYL 10 MG RE SUPP: 10.0000 mg | Freq: Every day | RECTAL | Status: DC | PRN

## 2023-10-18 MED ORDER — SODIUM CHLORIDE 0.9 % IV SOLN: INTRAVENOUS | Status: AC | PRN

## 2023-10-18 MED ORDER — TRAZODONE HCL 50 MG PO TABS
50.0000 mg | ORAL_TABLET | Freq: Every evening | ORAL | Status: DC | PRN
  Administered 2023-10-18: 50 mg via ORAL
  Filled 2023-10-18: qty 1

## 2023-10-18 MED ORDER — FENTANYL CITRATE PF 50 MCG/ML IJ SOSY
50.0000 ug | PREFILLED_SYRINGE | INTRAMUSCULAR | Status: DC | PRN
  Administered 2023-10-18: 50 ug via INTRAVENOUS
  Filled 2023-10-18: qty 1

## 2023-10-18 MED ORDER — IPRATROPIUM-ALBUTEROL 0.5-2.5 (3) MG/3ML IN SOLN
3.0000 mL | Freq: Once | RESPIRATORY_TRACT | Status: AC
  Administered 2023-10-18: 3 mL via RESPIRATORY_TRACT
  Filled 2023-10-18: qty 3

## 2023-10-18 MED ORDER — ACETAMINOPHEN 325 MG PO TABS
650.0000 mg | ORAL_TABLET | Freq: Four times a day (QID) | ORAL | Status: DC | PRN
  Administered 2023-10-18 – 2023-10-19 (×3): 650 mg via ORAL
  Filled 2023-10-18 (×2): qty 2

## 2023-10-18 MED ORDER — SODIUM CHLORIDE 0.9% FLUSH
3.0000 mL | Freq: Two times a day (BID) | INTRAVENOUS | Status: DC
  Administered 2023-10-18 – 2023-10-20 (×4): 3 mL via INTRAVENOUS

## 2023-10-18 MED ORDER — NICOTINE 21 MG/24HR TD PT24
21.0000 mg | MEDICATED_PATCH | Freq: Every day | TRANSDERMAL | Status: DC
  Administered 2023-10-18 – 2023-10-20 (×3): 21 mg via TRANSDERMAL
  Filled 2023-10-18 (×3): qty 1

## 2023-10-18 MED ORDER — HYDRALAZINE HCL 20 MG/ML IJ SOLN: 10.0000 mg | Freq: Four times a day (QID) | INTRAMUSCULAR | Status: DC | PRN

## 2023-10-18 MED ORDER — SODIUM CHLORIDE 0.9 % IV SOLN
2.0000 g | INTRAVENOUS | Status: DC
  Administered 2023-10-19: 2 g via INTRAVENOUS
  Filled 2023-10-18: qty 20

## 2023-10-18 NOTE — ED Notes (Signed)
Pt ambulated to bathroom with assistance. Pt assisted back to bed. Pt states medication for pain was effective.

## 2023-10-18 NOTE — H&P (Signed)
Patient Demographics:    Stephanie Nielsen, is a 62 y.o. female  MRN: [id number]   DOB - 18-Apr-1961  Admit Date - 10/18/2023  Outpatient Primary MD for the patient is Patient, No Pcp Per   Assessment & Plan:   Assessment and Plan:  1) sepsis due to CAP--left-sided pneumonia--POA mucolytics, bronchodilators, Rocephin/doxycycline as ordered -CTA chest pending --COVID-negative WBC 12.9  tachypneic, with respiratory rate in the mid 20s tachycardic with heart rate around 107,  2) acute COPD exacerbation--due to #1 above -IV Solu-Medrol, mucolytics bronchodilators as ordered  3) acute hypoxic respiratory failure--- due to #1 and #2 above -hypoxic with O2 sats down to 88 to 89% on room air required 2 L of oxygen to keep O2 sats above 90% Manage as above #1 #2  4)Atypical CP---- Denies chest pains at rest or with activity, reports consistently reproducible chest pains with coughing and with deep inspiration over the last couple days -Troponin 3 , repeat troponin 3, , EKG sinus rhythm without acute findings -D-dimer 0.29 BNP 20.0 -Chest x-ray suggest pneumonia and COPD -CTA chest pending  5) tobacco abuse--nicotine patch as ordered  Dispo: The patient is from: Home              Anticipated d/c is to: Home              Anticipated d/c date is: 2 days              Patient currently is not medically stable to d/c. Barriers: Not Clinically Stable-   With History of - Reviewed by me  Past Medical History:  Diagnosis Date   Hypertension       Past Surgical History:  Procedure Laterality Date   BREAST SURGERY     HERNIA REPAIR     TUBAL LIGATION     Chief Complaint  Patient presents with   Shortness of Breath      HPI:    Stephanie Nielsen  is a 62 y.o. female smoker with more than 40 pack history,  COPD, and history of HTN not on medications presents to the ED with worsening cough and shortness of breath over the last several days - Additional history obtained from husband at bedside -As per husband symptoms started over 5 days ago, with chills headaches cough that is at times productive, but mostly dry, over the last day or 2 after coughing for the last 5 days she has not developed chest discomfort with coughing.- -Denies chest pains at rest or with activity, reports consistently reproducible chest pains with coughing and with deep inspiration over the last couple days -No leg pains or leg swelling, no vomiting or diarrhea, no sick contacts, - In the ED system found to be tachypneic, with respiratory rate in the mid 20s tachycardic with heart rate around 107, hypertensive and hypoxic with O2 sats down to 88 to 89% on room air required 2 L of oxygen to keep  O2 sats above 90% -Chest x-ray suggestive of emphysema/COPD as well as possible left sided pneumonia -CTA chest is pending -UA is not suggestive of UTI -Urine tox screen with opiates and THC -COVID-negative -Troponin 3 , repeat troponin 3, , EKG sinus rhythm without acute findings -D-dimer 0.29 BNP 20.0 WBC 12.9 -LFTs are not elevated, creatinine 0.76   Review of systems:    In addition to the HPI above,   A full Review of  Systems was done, all other systems reviewed are negative except as noted above in HPI , .    Social History:  Reviewed by me    Social History   Tobacco Use   Smoking status: Every Day    Current packs/day: 0.50    Types: Cigarettes   Smokeless tobacco: Never  Substance Use Topics   Alcohol use: Yes     Family History :  Reviewed by me   History reviewed. No pertinent family history.    Home Medications:   Prior to Admission medications   Medication Sig Start Date End Date Taking? Authorizing Provider  albuterol (PROVENTIL HFA;VENTOLIN HFA) 108 (90 BASE) MCG/ACT inhaler Inhale 2 puffs  into the lungs every 6 (six) hours as needed for shortness of breath or wheezing.   Yes [provider]  oxyCODONE-acetaminophen (PERCOCET/ROXICET) 5-325 MG tablet Take 1 tablet by mouth every 6 (six) hours as needed for severe pain (pain score 7-10).   Yes [provider]  Phenylephrine-APAP-guaiFENesin [facility] SINUS-MAX) [phone] MG/20ML LIQD Take 1 Dose by mouth every 12 (twelve) hours as needed (Cough/Cold).   Yes [provider]     Allergies:     Allergies  Allergen Reactions   Bee Venom Swelling     Physical Exam:   Vitals  Blood pressure (!) 158/79, pulse (!) 107, temperature 98.1 F (36.7 C), temperature source Oral, resp. rate 20, height 5\' 1"  (1.549 m), weight 64 kg, SpO2 92%.  Physical Examination: General appearance - alert, conversational dyspnea noted Mental status - alert, oriented to person, place, and time,  Nose- [place] 2L/min Eyes - sclera anicteric Neck - supple, no JVD elevation , Chest -diminished breath sounds with scattered wheezes bilaterally Heart - S1 and S2 normal, regular  Abdomen - soft, nontender, nondistended, +BS Neurological - screening mental status exam normal, neck supple without rigidity, cranial nerves II through XII intact, DTR's normal and symmetric Extremities - no pedal edema noted, intact peripheral pulses  Skin - warm, dry   Data Review:    CBC Recent Labs  Lab 10/18/23 0845  WBC 12.9*  HGB 14.0  HCT 42.6  PLT 370  MCV 90.6  MCH 29.8  MCHC 32.9  RDW 14.4  LYMPHSABS 2.1  MONOABS 1.0  EOSABS 1.4*  BASOSABS 0.1   ------------------------------------------------------------------------------------------------------------------  Chemistries  Recent Labs  Lab 10/18/23 0845  NA 136  K 3.5  CL 102  CO2 23  GLUCOSE 130*  BUN 10  CREATININE 0.76  CALCIUM 9.2  AST 20  ALT 19  ALKPHOS 71  BILITOT 0.5    ------------------------------------------------------------------------------------------------------------------ estimated creatinine clearance is 64.1 mL/min (by C-G formula based on SCr of 0.76 mg/dL). ------------------------------------------------------------------------------------------------------------------ ------------------------------------------------------------------------------------------------------------------- Recent Labs    10/18/23 0845  DDIMER 0.29   ------------------------------------------------------------------------------------------------------------------------------------------------------------------------------------------------------------------------------------    Component Value Date/Time   BNP 20.0 10/18/2023 0845   Urinalysis    Component Value Date/Time   COLORURINE YELLOW 10/18/2023 1505   APPEARANCEUR CLEAR 10/18/2023 1505   LABSPEC 1.030 10/18/2023 1505   PHURINE 5.0 10/18/2023  1505   GLUCOSEU NEGATIVE 10/18/2023 1505   HGBUR MODERATE (A) 10/18/2023 1505   BILIRUBINUR SMALL (A) 10/18/2023 1505   KETONESUR 5 (A) 10/18/2023 1505   PROTEINUR 30 (A) 10/18/2023 1505   NITRITE NEGATIVE 10/18/2023 1505   LEUKOCYTESUR NEGATIVE 10/18/2023 1505    Imaging Results:    DG Chest Portable 1 View  Result Date: 10/18/2023 CLINICAL DATA:  Shortness of breath EXAM: PORTABLE CHEST 1 VIEW COMPARISON:  Chest radiograph dated June 01, 2020. FINDINGS: The heart size and mediastinal contours are within normal limits. Emphysematous changes with mild hyperinflation. Hazy opacity at the left lateral lung base with mild indistinctness of the left costophrenic angle. Streaky right midlung opacities. No pneumothorax. No acute osseous abnormality. IMPRESSION: 1. Hazy opacity at the left lateral lung base with slight indistinctness of the left costophrenic angle could represent a small effusion or infiltrate. Additional streaky opacities at the right midlung could  also represent an infiltrate. 2. Emphysema with mild hyperinflation, compatible with COPD. Electronically Signed   By: Hart Robinsons M.D.   On: 10/18/2023 12:12    Radiological Exams on Admission: DG Chest Portable 1 View  Result Date: 10/18/2023 CLINICAL DATA:  Shortness of breath EXAM: PORTABLE CHEST 1 VIEW COMPARISON:  Chest radiograph dated June 01, 2020. FINDINGS: The heart size and mediastinal contours are within normal limits. Emphysematous changes with mild hyperinflation. Hazy opacity at the left lateral lung base with mild indistinctness of the left costophrenic angle. Streaky right midlung opacities. No pneumothorax. No acute osseous abnormality. IMPRESSION: 1. Hazy opacity at the left lateral lung base with slight indistinctness of the left costophrenic angle could represent a small effusion or infiltrate. Additional streaky opacities at the right midlung could also represent an infiltrate. 2. Emphysema with mild hyperinflation, compatible with COPD. Electronically Signed   By: Hart Robinsons M.D.   On: 10/18/2023 12:12    DVT Prophylaxis -SCD /Heparin AM Labs Ordered, also please review Full Orders  Family Communication: Admission, patients condition and plan of care including tests being ordered have been discussed with the patient and husband who indicate understanding and agree with the plan   Condition -stable  Shon Hale M.D on 10/18/2023 at 6:11 PM Go to [email or web address] -  for contact info  [facility] - Office  [phone]

## 2023-10-18 NOTE — Plan of Care (Signed)
  Problem: Education: Goal: Knowledge of General Education information will improve Description Including pain rating scale, medication(s)/side effects and non-pharmacologic comfort measures Outcome: Progressing   Problem: Health Behavior/Discharge Planning: Goal: Ability to manage health-related needs will improve Outcome: Progressing   

## 2023-10-18 NOTE — ED Notes (Signed)
Pt requests something for headache and chest pain from coughing. Pt rates pain at 10 on scale of 0-10. EDP notified.

## 2023-10-18 NOTE — ED Triage Notes (Signed)
Pt complains of shortness of breath for 5 days and O2 sats in 80's at home for the last couple of days. Pt complains of cough and headache from coughing. Cough not productive. Pt states she was diagnosed with COPD years ago but states "I don't know if that's really true." Pt smokes but states she hasn't smoked for a couple of days. O2 89% on arrival. Pt placed on 2 liters O2.

## 2023-10-18 NOTE — ED Provider Notes (Signed)
[place] EMERGENCY DEPARTMENT AT [facility] Provider Note  CSN: [id number] Arrival date & time: 10/18/23 4166  Chief Complaint(s) Shortness of Breath  HPI Stephanie Nielsen is a 62 y.o. female history of hypertension presenting to the emergency department shortness of breath.  She reports 5 days of shortness of breath.  Reports that she has been feeling worse at home.  Reports that she was diagnosed with COPD and was given an inhaler but not sure if she believes this.  Inhaler did not really help.  No recent travel or surgeries.  Does continue to smoke cigarettes.  Reports associated cough, some chest pain with coughing.  Reports the pain is also little pleuritic.  No fevers.  No sick contacts.  No runny nose or sore throat.  No abdominal pain.  No nausea, reports a couple episodes of vomiting after coughing spells.  Also reports a headache with cough which is mild.   Past Medical History Past Medical History:  Diagnosis Date   Hypertension    Patient Active Problem List   Diagnosis Date Noted   COPD with acute exacerbation (HCC) 10/18/2023   HTN (hypertension) 10/18/2023   Tobacco abuse 10/18/2023   Home Medication(s) Prior to Admission medications   Medication Sig Start Date End Date Taking? Authorizing Provider  albuterol (PROVENTIL HFA;VENTOLIN HFA) 108 (90 BASE) MCG/ACT inhaler Inhale 2 puffs into the lungs every 6 (six) hours as needed.    [provider]  cyclobenzaprine (FLEXERIL) 10 MG tablet 1/2 to one  Tab po TID 12/24/12   Worthy Rancher, PA-C  HYDROcodone-acetaminophen (NORCO/VICODIN) 5-325 MG tablet Take 1 tablet by mouth every 6 (six) hours as needed for severe pain. 06/13/22   Idol, Raynelle Fanning, PA-C  naproxen (NAPROSYN) 375 MG tablet Take 1 tablet (375 mg total) by mouth 2 (two) times daily with a meal. 06/01/20   Blane Ohara, MD  predniSONE (DELTASONE) 10 MG tablet 6, 5, 4, 3, 2 then 1 tablet by mouth daily for 6 days total. 06/13/22   Victoriano Lain                                                                                                                                     Past Surgical History Past Surgical History:  Procedure Laterality Date   BREAST SURGERY     HERNIA REPAIR     TUBAL LIGATION     Family History History reviewed. No pertinent family history.  Social History Social History   Tobacco Use   Smoking status: Every Day    Current packs/day: 0.50    Types: Cigarettes   Smokeless tobacco: Never  Vaping Use   Vaping status: Never Used  Substance Use Topics   Alcohol use: Yes   Drug use: Yes    Types: Marijuana    Comment: Delta 8   Allergies Bee venom  Review of Systems Review of Systems  All other  systems reviewed and are negative.   Physical Exam Vital Signs  I have reviewed the triage vital signs BP (!) 155/87 (BP Location: Left Arm)   Pulse 94   Temp 98.3 F (36.8 C) (Oral)   Resp (!) 24   Ht 5\' 1"  (1.549 m)   Wt 64 kg   SpO2 92%   BMI 26.64 kg/m  Physical Exam Vitals and nursing note reviewed.  Constitutional:      General: She is not in acute distress.    Appearance: She is well-developed.  HENT:     Head: Normocephalic and atraumatic.     Mouth/Throat:     Mouth: Mucous membranes are moist.  Eyes:     Pupils: Pupils are equal, round, and reactive to light.  Cardiovascular:     Rate and Rhythm: Normal rate and regular rhythm.     Heart sounds: No murmur heard. Pulmonary:     Effort: Pulmonary effort is normal. Tachypnea present. No respiratory distress.     Breath sounds: Examination of the right-upper field reveals wheezing. Examination of the left-upper field reveals wheezing. Examination of the right-middle field reveals wheezing. Examination of the left-middle field reveals wheezing. Examination of the right-lower field reveals wheezing. Examination of the left-lower field reveals wheezing. Wheezing present.  Abdominal:     General: Abdomen is flat.     Palpations:  Abdomen is soft.     Tenderness: There is no abdominal tenderness.  Musculoskeletal:        General: No tenderness.     Right lower leg: No edema.     Left lower leg: No edema.  Skin:    General: Skin is warm and dry.  Neurological:     General: No focal deficit present.     Mental Status: She is alert. Mental status is at baseline.  Psychiatric:        Mood and Affect: Mood normal.        Behavior: Behavior normal.     ED Results and Treatments Labs (all labs ordered are listed, but only abnormal results are displayed) Labs Reviewed  COMPREHENSIVE METABOLIC PANEL - Abnormal; Notable for the following components:      Result Value   Glucose, Bld 130 (*)    All other components within normal limits  CBC WITH DIFFERENTIAL/PLATELET - Abnormal; Notable for the following components:   WBC 12.9 (*)    Neutro Abs 8.3 (*)    Eosinophils Absolute 1.4 (*)    All other components within normal limits  SARS CORONAVIRUS 2 BY RT PCR  BRAIN NATRIURETIC PEPTIDE  D-DIMER, QUANTITATIVE  HIV ANTIBODY (ROUTINE TESTING W REFLEX)  TROPONIN I (HIGH SENSITIVITY)  TROPONIN I (HIGH SENSITIVITY)                                                                                                                          Radiology No results found.  Pertinent labs & imaging results that were available during my  care of the patient were reviewed by me and considered in my medical decision making (see MDM for details).  Medications Ordered in ED Medications  cefTRIAXone (ROCEPHIN) 1 g in sodium chloride 0.9 % 100 mL IVPB (1 g Intravenous New Bag/Given 10/18/23 1119)  ipratropium-albuterol (DUONEB) 0.5-2.5 (3) MG/3ML nebulizer solution 3 mL (has no administration in time range)  HYDROcodone-acetaminophen (NORCO/VICODIN) 5-325 MG per tablet 1 tablet (has no administration in time range)  methylPREDNISolone sodium succinate (SOLU-MEDROL) 40 mg/mL injection 40 mg (has no administration in time range)   albuterol (PROVENTIL) (2.5 MG/3ML) 0.083% nebulizer solution 2.5 mg (has no administration in time range)  ipratropium-albuterol (DUONEB) 0.5-2.5 (3) MG/3ML nebulizer solution 3 mL (has no administration in time range)  sodium chloride flush (NS) 0.9 % injection 3 mL (has no administration in time range)  sodium chloride flush (NS) 0.9 % injection 3 mL (has no administration in time range)  sodium chloride flush (NS) 0.9 % injection 3 mL (has no administration in time range)  0.9 %  sodium chloride infusion (has no administration in time range)  acetaminophen (TYLENOL) tablet 650 mg (has no administration in time range)    Or  acetaminophen (TYLENOL) suppository 650 mg (has no administration in time range)  traZODone (DESYREL) tablet 50 mg (has no administration in time range)  polyethylene glycol (MIRALAX / GLYCOLAX) packet 17 g (has no administration in time range)  bisacodyl (DULCOLAX) suppository 10 mg (has no administration in time range)  ondansetron (ZOFRAN) tablet 4 mg (has no administration in time range)    Or  ondansetron (ZOFRAN) injection 4 mg (has no administration in time range)  heparin injection 5,000 Units (has no administration in time range)  guaiFENesin (MUCINEX) 12 hr tablet 600 mg (has no administration in time range)  doxycycline (VIBRA-TABS) tablet 100 mg (has no administration in time range)  ipratropium-albuterol (DUONEB) 0.5-2.5 (3) MG/3ML nebulizer solution 3 mL (3 mLs Nebulization Given 10/18/23 0854)  methylPREDNISolone sodium succinate (SOLU-MEDROL) 125 mg/2 mL injection 125 mg (125 mg Intravenous Given 10/18/23 0900)                                                                                                                                     Procedures .Critical Care  Performed by: Lonell Grandchild, MD Authorized by: Lonell Grandchild, MD   Critical care provider statement:    Critical care time (minutes):  30   Critical care was necessary to  treat or prevent imminent or life-threatening deterioration of the following conditions:  Respiratory failure   Critical care was time spent personally by me on the following activities:  Development of treatment plan with patient or surrogate, discussions with consultants, evaluation of patient's response to treatment, examination of patient, ordering and review of laboratory studies, ordering and review of radiographic studies, ordering and performing treatments and interventions, pulse oximetry, re-evaluation of patient's condition and review of old charts  Care discussed with: admitting provider     (including critical care time)  Medical Decision Making / ED Course   MDM:  62 year old female presenting to the emergency department with cough.  Patient vitals notable for initial hypoxia to 89%.  She was placed on 2 L by nursing and is now in the 90-93 range.  She is tachypneic and has wheezing on exam.  Suspect likely COPD exacerbation.  Will trial steroids, breathing treatment.  If patient remains hypoxic will likely need to be admitted.  Will obtain chest x-ray to evaluate for underlying pneumonia.  She does have some productive cough so will probably need antibiotics if she is able to go home.  She reports some chest pain which sounds most likely musculoskeletal in the setting of cough, her troponin is negative, EKG reassuring.  Low concern for ACS.  Considered PE given hypoxia, D-dimer is negative and patient has no recent risk factors so low concern for pulmonary embolism at this time.  Will reassess.      Additional history obtained: -Additional history obtained from spouse -External records from outside source obtained and reviewed including: Chart review including previous notes, labs, imaging, consultation notes including prior er visits    Lab Tests: -I ordered, reviewed, and interpreted labs.   The pertinent results include:   Labs Reviewed  COMPREHENSIVE METABOLIC PANEL -  Abnormal; Notable for the following components:      Result Value   Glucose, Bld 130 (*)    All other components within normal limits  CBC WITH DIFFERENTIAL/PLATELET - Abnormal; Notable for the following components:   WBC 12.9 (*)    Neutro Abs 8.3 (*)    Eosinophils Absolute 1.4 (*)    All other components within normal limits  SARS CORONAVIRUS 2 BY RT PCR  BRAIN NATRIURETIC PEPTIDE  D-DIMER, QUANTITATIVE  HIV ANTIBODY (ROUTINE TESTING W REFLEX)  TROPONIN I (HIGH SENSITIVITY)  TROPONIN I (HIGH SENSITIVITY)    Notable for mild leukocytosis   EKG   EKG Interpretation Date/Time:  Friday October 18 2023 08:17:51 EDT Ventricular Rate:  95 PR Interval:  157 QRS Duration:  76 QT Interval:  371 QTC Calculation: 467 R Axis:   76  Text Interpretation: Sinus rhythm Right atrial enlargement Confirmed by Alvino Blood ([id number]) on 10/18/2023 9:44:48 AM         Imaging Studies ordered: I ordered imaging studies including CXR On my interpretation imaging demonstrates possible LLL effusion/infiltrate  I independently visualized and interpreted imaging.  Medicines ordered and prescription drug management: Meds ordered this encounter  Medications   ipratropium-albuterol (DUONEB) 0.5-2.5 (3) MG/3ML nebulizer solution 3 mL   methylPREDNISolone sodium succinate (SOLU-MEDROL) 125 mg/2 mL injection 125 mg   DISCONTD: fentaNYL (SUBLIMAZE) injection 50 mcg   cefTRIAXone (ROCEPHIN) 1 g in sodium chloride 0.9 % 100 mL IVPB    Order Specific Question:   Antibiotic Indication:    Answer:   Other Indication (list below)   ipratropium-albuterol (DUONEB) 0.5-2.5 (3) MG/3ML nebulizer solution 3 mL   HYDROcodone-acetaminophen (NORCO/VICODIN) 5-325 MG per tablet 1 tablet   methylPREDNISolone sodium succinate (SOLU-MEDROL) 40 mg/mL injection 40 mg   albuterol (PROVENTIL) (2.5 MG/3ML) 0.083% nebulizer solution 2.5 mg   ipratropium-albuterol (DUONEB) 0.5-2.5 (3) MG/3ML nebulizer solution 3 mL    sodium chloride flush (NS) 0.9 % injection 3 mL   sodium chloride flush (NS) 0.9 % injection 3 mL   sodium chloride flush (NS) 0.9 % injection 3 mL   0.9 %  sodium chloride  infusion   OR Linked Order Group    acetaminophen (TYLENOL) tablet 650 mg    acetaminophen (TYLENOL) suppository 650 mg   traZODone (DESYREL) tablet 50 mg   polyethylene glycol (MIRALAX / GLYCOLAX) packet 17 g   bisacodyl (DULCOLAX) suppository 10 mg   OR Linked Order Group    ondansetron (ZOFRAN) tablet 4 mg    ondansetron (ZOFRAN) injection 4 mg   heparin injection 5,000 Units   guaiFENesin (MUCINEX) 12 hr tablet 600 mg   doxycycline (VIBRA-TABS) tablet 100 mg    -I have reviewed the patients home medicines and have made adjustments as needed   Consultations Obtained: I requested consultation with the hospitalist,  and discussed lab and imaging findings as well as pertinent plan - they recommend: admission   Cardiac Monitoring: The patient was maintained on a cardiac monitor.  I personally viewed and interpreted the cardiac monitored which showed an underlying rhythm of: NSR  Social Determinants of Health:  Diagnosis or treatment significantly limited by social determinants of health: current smoker   Reevaluation: After the interventions noted above, I reevaluated the patient and found that their symptoms have improved  Co morbidities that complicate the patient evaluation  Past Medical History:  Diagnosis Date   Hypertension       Dispostion: Disposition decision including need for hospitalization was considered, and patient admitted to the hospital.    Final Clinical Impression(s) / ED Diagnoses Final diagnoses:  COPD exacerbation (HCC)  Acute hypoxic respiratory failure (HCC)     This chart was dictated using voice recognition software.  Despite best efforts to proofread,  errors can occur which can change the documentation meaning.    Lonell Grandchild, MD 10/18/23 [phone]

## 2023-10-18 NOTE — ED Notes (Signed)
Pt states fentanyl was effective for pain but did not last very long. Attending notified of pt's pain medication request.

## 2023-10-19 LAB — CBC
HCT: 41.8 % (ref 36.0–46.0)
Hemoglobin: 13.5 g/dL (ref 12.0–15.0)
MCH: 29.5 pg (ref 26.0–34.0)
MCHC: 32.3 g/dL (ref 30.0–36.0)
MCV: 91.3 fL (ref 80.0–100.0)
Platelets: 364 10*3/uL (ref 150–400)
RBC: 4.58 MIL/uL (ref 3.87–5.11)
RDW: 14.4 % (ref 11.5–15.5)
WBC: 25.5 10*3/uL — ABNORMAL HIGH (ref 4.0–10.5)
nRBC: 0 % (ref 0.0–0.2)

## 2023-10-19 LAB — BASIC METABOLIC PANEL
Anion gap: 8 (ref 5–15)
BUN: 14 mg/dL (ref 6–20)
CO2: 24 mmol/L (ref 22–32)
Calcium: 9.1 mg/dL (ref 8.9–10.3)
Chloride: 103 mmol/L (ref 98–111)
Creatinine, Ser: 0.77 mg/dL (ref 0.44–1.00)
GFR, Estimated: >60 mL/min (ref >60)
Glucose, Bld: 136 mg/dL — ABNORMAL HIGH (ref 70–99)
Potassium: 3.8 mmol/L (ref 3.5–5.1)
Sodium: 135 mmol/L (ref 135–145)

## 2023-10-19 LAB — HIV ANTIBODY (ROUTINE TESTING W REFLEX): HIV Screen 4th Generation wRfx: NONREACTIVE

## 2023-10-19 MED ORDER — PHENOL 1.4 % MT LIQD
1.0000 | OROMUCOSAL | Status: DC | PRN
  Administered 2023-10-19: 1 via OROMUCOSAL
  Filled 2023-10-19: qty 177

## 2023-10-19 MED ORDER — TRAZODONE HCL 50 MG PO TABS
150.0000 mg | ORAL_TABLET | Freq: Every day | ORAL | Status: DC
  Administered 2023-10-19: 150 mg via ORAL
  Filled 2023-10-19: qty 3

## 2023-10-19 MED ORDER — ALUM & MAG HYDROXIDE-SIMETH 200-200-20 MG/5ML PO SUSP
30.0000 mL | ORAL | Status: DC | PRN
  Administered 2023-10-19 – 2023-10-20 (×5): 30 mL via ORAL
  Filled 2023-10-19 (×5): qty 30

## 2023-10-19 NOTE — Progress Notes (Signed)
Notified provider of stomach burning with meds.  Maalox given.

## 2023-10-19 NOTE — Progress Notes (Addendum)
PROGRESS NOTE  Stephanie Nielsen, is a 62 y.o. female, DOB - [birth year], WUJ:[id number]  Admit date - 10/18/2023   Admitting Physician Elisa Kutner Mariea Clonts, MD  Outpatient Primary MD for the patient is Patient, No Pcp Per  LOS - 0  Chief Complaint  Patient presents with   Shortness of Breath        Brief Narrative:   62 y.o. female smoker with more than 40 pack history, COPD, and history of HTN admitted on 10/18/2023 with acute COPD exacerbation and hypoxia    -Assessment and Plan:  1)acute COPD exacerbation-- -Initial suspicion of sepsis and pneumonia Not confirmed -Sepsis ruled out --pneumonia ruled out ---COVID-negative -CTA chest without PE or pneumonia --IV Solu-Medrol, mucolytics bronchodilators as ordered -Received Rocephin and doxycycline -Okay to de-escalate to doxycycline monotherapy   2)  acute hypoxic respiratory failure--- due to #1  above -hypoxic with O2 sats down to 88 to 89% on room air required 2 L of oxygen to keep O2 sats above 90% Manage as above #1 -Continue to try to wean off oxygen   3) Atypical CP---- Denies chest pains at rest or with activity, reports consistently reproducible chest pains with coughing and with deep inspiration over the last couple days -Troponin 3 , repeat troponin 3, , EKG sinus rhythm without acute findings -D-dimer 0.29 BNP 20.0 -CTA chest without pulmonary embolism  4)Tobacco abuse-- Smoking cessation counseling for 4 minutes today,   I have discussed tobacco cessation with the patient.  I have counseled the patient regarding the negative impacts of continued tobacco use including but not limited to lung cancer, COPD, and cardiovascular disease.  I have discussed alternatives to tobacco and modalities that may help facilitate tobacco cessation including but not limited to biofeedback, hypnosis, and medications.  Total time spent with tobacco counseling was 4 minutes -Nicotine patch as prescribed.  Dispo: The patient is from:  Home              Anticipated d/c is to: Home              Anticipated d/c date is: 2 days              Patient currently is not medically stable to d/c. Barriers: Not Clinically Stable-   Status is: Inpatient   Code Status :  -  Code Status: Full Code   Family Communication:    NA (patient is alert, awake and coherent)   DVT Prophylaxis  :   - SCDs   heparin injection 5,000 Units Start: 10/18/23 1400 SCDs Start: 10/18/23 1125 Place TED hose Start: 10/18/23 1125   Lab Results  Component Value Date   PLT 364 10/19/2023    Inpatient Medications  Scheduled Meds:  dextromethorphan-guaiFENesin  1 tablet Oral BID   doxycycline  100 mg Oral Q12H   heparin  5,000 Units Subcutaneous Q8H   ipratropium-albuterol  3 mL Nebulization Once   ipratropium-albuterol  3 mL Nebulization Q6H   methylPREDNISolone (SOLU-MEDROL) injection  40 mg Intravenous Q12H   nicotine  21 mg Transdermal Daily   sodium chloride flush  3 mL Intravenous Q12H   sodium chloride flush  3 mL Intravenous Q12H   traZODone  150 mg Oral QHS   Continuous Infusions:  cefTRIAXone (ROCEPHIN)  IV Stopped (10/19/23 1650)   PRN Meds:.acetaminophen **OR** acetaminophen, albuterol, alum & mag hydroxide-simeth, bisacodyl, chlorpheniramine-HYDROcodone, guaiFENesin-dextromethorphan, hydrALAZINE, HYDROcodone-acetaminophen, ondansetron **OR** ondansetron (ZOFRAN) IV, phenol, polyethylene glycol, sodium chloride flush   Anti-infectives (From admission, onward)  Start     Dose/Rate Route Frequency Ordered Stop   10/19/23 1000  doxycycline (VIBRA-TABS) tablet 100 mg        100 mg Oral Every 12 hours 10/18/23 1126     10/19/23 1000  cefTRIAXone (ROCEPHIN) 2 g in sodium chloride 0.9 % 100 mL IVPB        2 g 200 mL/hr over 30 Minutes Intravenous Every 24 hours 10/18/23 1640     10/18/23 1115  cefTRIAXone (ROCEPHIN) 1 g in sodium chloride 0.9 % 100 mL IVPB        1 g 200 mL/hr over 30 Minutes Intravenous  Once 10/18/23 1108  10/18/23 1149         Subjective: Stephanie Nielsen today has no fevers, no emesis, - Cough and dyspnea persist -Unable to completely weaned off oxygen -Pleuritic chest discomfort improving   Objective: Vitals:   10/19/23 0801 10/19/23 1206 10/19/23 1324 10/19/23 1458  BP:   134/67   Pulse:   84   Resp:      Temp:   98.1 F (36.7 C)   TempSrc:      SpO2: 93% 92% 94% 95%  Weight:      Height:        Intake/Output Summary (Last 24 hours) at 10/19/2023 1826 Last data filed at 10/19/2023 1700 Gross per 24 hour  Intake 960 ml  Output --  Net 960 ml   Filed Weights   10/18/23 0822  Weight: 64 kg    Physical Exam  Gen:- Awake Alert, some conversational dyspnea HEENT:- [place].AT, No sclera icterus Nose- [place] 2L/min Neck-Supple Neck,No JVD,.  Lungs-diminished breath sounds, scattered wheezes bilaterally  CV- S1, S2 normal, regular  Abd-  +ve B.Sounds, Abd Soft, No tenderness,    Extremity/Skin:- No  edema, pedal pulses present  Psych-affect is appropriate, oriented x3 Neuro-no new focal deficits, no tremors  Data Reviewed: I have personally reviewed following labs and imaging studies  CBC: Recent Labs  Lab 10/18/23 0845 10/19/23 0425  WBC 12.9* 25.5*  NEUTROABS 8.3*  --   HGB 14.0 13.5  HCT 42.6 41.8  MCV 90.6 91.3  PLT 370 364   Basic Metabolic Panel: Recent Labs  Lab 10/18/23 0845 10/19/23 0425  NA 136 135  K 3.5 3.8  CL 102 103  CO2 23 24  GLUCOSE 130* 136*  BUN 10 14  CREATININE 0.76 0.77  CALCIUM 9.2 9.1   GFR: Estimated Creatinine Clearance: 64.1 mL/min (by C-G formula based on SCr of 0.77 mg/dL). Liver Function Tests: Recent Labs  Lab 10/18/23 0845  AST 20  ALT 19  ALKPHOS 71  BILITOT 0.5  PROT 7.5  ALBUMIN 4.0   Recent Results (from the past 240 hour(s))  SARS Coronavirus 2 by RT PCR (hospital order, performed in [facility] hospital lab) *cepheid single result test* Anterior Nasal Swab     Status: None   Collection Time: 10/18/23  11:29 AM   Specimen: Anterior Nasal Swab  Result Value Ref Range Status   SARS Coronavirus 2 by RT PCR NEGATIVE NEGATIVE Final    Comment: (NOTE) SARS-CoV-2 target nucleic acids are NOT DETECTED.  The SARS-CoV-2 RNA is generally detectable in upper and lower respiratory specimens during the acute phase of infection. The lowest concentration of SARS-CoV-2 viral copies this assay can detect is 250 copies / mL. A negative result does not preclude SARS-CoV-2 infection and should not be used as the sole basis for treatment or other patient management decisions.  A negative result may occur with improper specimen collection / handling, submission of specimen other than nasopharyngeal swab, presence of viral mutation(s) within the areas targeted by this assay, and inadequate number of viral copies (<250 copies / mL). A negative result must be combined with clinical observations, patient history, and epidemiological information.  Fact Sheet for Patients:   RoadLapTop.co.za  Fact Sheet for Healthcare Providers: http://kim-miller.com/  This test is not yet approved or  cleared by the [place] FDA and has been authorized for detection and/or diagnosis of SARS-CoV-2 by FDA under an Emergency Use Authorization (EUA).  This EUA will remain in effect (meaning this test can be used) for the duration of the COVID-19 declaration under Section 564(b)(1) of the Act, 21 U.S.C. section 360bbb-3(b)(1), unless the authorization is terminated or revoked sooner.  Performed at [facility], [address] [place], [place] [address]     Radiology Studies: CT Angio Chest Pulmonary Embolism (PE) W or WO Contrast  Result Date: 10/18/2023 CLINICAL DATA:  Shortness of breath and cough. Concern for pulmonary embolism. EXAM: CT ANGIOGRAPHY CHEST WITH CONTRAST TECHNIQUE: Multidetector CT imaging of the chest was performed using the standard protocol during bolus  administration of intravenous contrast. Multiplanar CT image reconstructions and MIPs were obtained to evaluate the vascular anatomy. RADIATION DOSE REDUCTION: This exam was performed according to the departmental dose-optimization program which includes automated exposure control, adjustment of the mA and/or kV according to patient size and/or use of iterative reconstruction technique. CONTRAST:  75mL OMNIPAQUE IOHEXOL 350 MG/ML SOLN COMPARISON:  Chest radiograph dated 10/18/2023. FINDINGS: Cardiovascular: There is no cardiomegaly or pericardial effusion. Mild atherosclerotic calcification of the thoracic aorta. No aneurysmal dilatation or dissection. The origins of the great vessels of the aortic arch appear patent. No pulmonary artery embolus identified. Mediastinum/Nodes: No hilar or mediastinal adenopathy. The esophagus is grossly unremarkable. No mediastinal fluid collection. Lungs/Pleura: Minimal bibasilar dependent atelectasis. No consolidative changes. There is no pleural effusion or pneumothorax. The central airways are patent. Upper Abdomen: No acute abnormality. Musculoskeletal: No acute osseous pathology. Bilateral breast implants. The left breast implant is deflated. Review of the MIP images confirms the above findings. IMPRESSION: 1. No acute intrathoracic pathology. No CT evidence of pulmonary artery embolus. 2.  Aortic Atherosclerosis (ICD10-I70.0). Electronically Signed   By: Elgie Collard M.D.   On: 10/18/2023 20:29   DG Chest Portable 1 View  Result Date: 10/18/2023 CLINICAL DATA:  Shortness of breath EXAM: PORTABLE CHEST 1 VIEW COMPARISON:  Chest radiograph dated June 01, 2020. FINDINGS: The heart size and mediastinal contours are within normal limits. Emphysematous changes with mild hyperinflation. Hazy opacity at the left lateral lung base with mild indistinctness of the left costophrenic angle. Streaky right midlung opacities. No pneumothorax. No acute osseous abnormality. IMPRESSION:  1. Hazy opacity at the left lateral lung base with slight indistinctness of the left costophrenic angle could represent a small effusion or infiltrate. Additional streaky opacities at the right midlung could also represent an infiltrate. 2. Emphysema with mild hyperinflation, compatible with COPD. Electronically Signed   By: Hart Robinsons M.D.   On: 10/18/2023 12:12    Scheduled Meds:  dextromethorphan-guaiFENesin  1 tablet Oral BID   doxycycline  100 mg Oral Q12H   heparin  5,000 Units Subcutaneous Q8H   ipratropium-albuterol  3 mL Nebulization Once   ipratropium-albuterol  3 mL Nebulization Q6H   methylPREDNISolone (SOLU-MEDROL) injection  40 mg Intravenous Q12H   nicotine  21 mg Transdermal Daily   sodium  chloride flush  3 mL Intravenous Q12H   sodium chloride flush  3 mL Intravenous Q12H   traZODone  150 mg Oral QHS   Continuous Infusions:  cefTRIAXone (ROCEPHIN)  IV Stopped (10/19/23 1650)     LOS: 0 days    Shon Hale M.D on 10/19/2023 at 6:26 PM  Go to [email or web address] - for contact info  [facility] - Office  [phone]  If 7PM-7AM, please contact night-coverage [email or web address] 10/19/2023, 6:26 PM

## 2023-10-20 ENCOUNTER — Other Ambulatory Visit: Payer: Self-pay | Admitting: Family Medicine

## 2023-10-20 MED ORDER — FLUTICASONE-SALMETEROL 250-50 MCG/ACT IN AEPB: 1.0000 | INHALATION_SPRAY | Freq: Two times a day (BID) | RESPIRATORY_TRACT | 11 refills | Status: AC

## 2023-10-20 MED ORDER — DM-GUAIFENESIN ER 30-600 MG PO TB12: 1.0000 | ORAL_TABLET | Freq: Two times a day (BID) | ORAL | 0 refills | Status: AC

## 2023-10-20 MED ORDER — ALBUTEROL SULFATE HFA 108 (90 BASE) MCG/ACT IN AERS: 2.0000 | INHALATION_SPRAY | RESPIRATORY_TRACT | 3 refills | Status: AC | PRN

## 2023-10-20 MED ORDER — PREDNISONE 20 MG PO TABS
50.0000 mg | ORAL_TABLET | Freq: Once | ORAL | Status: AC
  Administered 2023-10-20: 50 mg via ORAL
  Filled 2023-10-20: qty 1

## 2023-10-20 MED ORDER — ALBUTEROL SULFATE (2.5 MG/3ML) 0.083% IN NEBU: 2.5000 mg | INHALATION_SOLUTION | RESPIRATORY_TRACT | 2 refills | Status: AC | PRN

## 2023-10-20 MED ORDER — ACETAMINOPHEN 325 MG PO TABS: 650.0000 mg | ORAL_TABLET | Freq: Four times a day (QID) | ORAL | Status: DC | PRN

## 2023-10-20 MED ORDER — TRAZODONE HCL 100 MG PO TABS: 100.0000 mg | ORAL_TABLET | Freq: Every day | ORAL | 0 refills | Status: AC

## 2023-10-20 MED ORDER — COMPRESSOR/NEBULIZER MISC: 1.0000 [IU] | Freq: Three times a day (TID) | 0 refills | Status: AC | PRN

## 2023-10-20 MED ORDER — FLUTICASONE-SALMETEROL 230-21 MCG/ACT IN AERO: 2.0000 | INHALATION_SPRAY | Freq: Two times a day (BID) | RESPIRATORY_TRACT | 12 refills | Status: DC

## 2023-10-20 MED ORDER — NICOTINE 21 MG/24HR TD PT24: 21.0000 mg | MEDICATED_PATCH | Freq: Every day | TRANSDERMAL | 0 refills | Status: AC

## 2023-10-20 MED ORDER — AZITHROMYCIN 500 MG PO TABS: 500.0000 mg | ORAL_TABLET | Freq: Every day | ORAL | 0 refills | Status: AC

## 2023-10-20 MED ORDER — HYDROCOD POLI-CHLORPHE POLI ER 10-8 MG/5ML PO SUER: 5.0000 mL | Freq: Two times a day (BID) | ORAL | 0 refills | Status: DC | PRN

## 2023-10-20 MED ORDER — PREDNISONE 20 MG PO TABS: 40.0000 mg | ORAL_TABLET | Freq: Every day | ORAL | 0 refills | Status: AC

## 2023-10-20 NOTE — Discharge Summary (Signed)
Terrina Docter, is a 62 y.o. female  DOB [birth year]  MRN [id number].  Admission date:  10/18/2023  Admitting Physician  Shon Hale, MD  Discharge Date:  10/20/2023   Primary MD  Patient, No Pcp Per  Recommendations for primary care physician for things to follow:  1) complete abstinence from tobacco advised--- okay to use over-the-counter nicotine patch to help you quit smoking 2) please take medications as prescribed including inhalers-- 3) follow-up with primary care physician of your choice for recheck and reevaluation in 1 to 2 weeks 4)Use [email or web address] coupons or other store coupons--to get discount on your medications  Admission Diagnosis  COPD exacerbation (HCC) [J44.1] COPD with acute exacerbation (HCC) [J44.1] Acute hypoxic respiratory failure (HCC) [J96.01]   Discharge Diagnosis  COPD exacerbation (HCC) [J44.1] COPD with acute exacerbation (HCC) [J44.1] Acute hypoxic respiratory failure (HCC) [J96.01]    Principal Problem:   COPD with acute exacerbation (HCC) Active Problems:   Acute hypoxic respiratory failure (HCC)   HTN (hypertension)   Tobacco abuse      Past Medical History:  Diagnosis Date   Hypertension     Past Surgical History:  Procedure Laterality Date   BREAST SURGERY     HERNIA REPAIR     TUBAL LIGATION         HPI  from the history and physical done on the day of admission:    Pietra Zuluaga  is a 61 y.o. female smoker with more than 40 pack history, COPD, and history of HTN not on medications presents to the ED with worsening cough and shortness of breath over the last several days - Additional history obtained from husband at bedside -As per husband symptoms started over 5 days ago, with chills headaches cough that is at times productive, but mostly dry, over the last day or 2 after coughing for the last 5 days she has not developed chest discomfort with  coughing.- -Denies chest pains at rest or with activity, reports consistently reproducible chest pains with coughing and with deep inspiration over the last couple days -No leg pains or leg swelling, no vomiting or diarrhea, no sick contacts, - In the ED system found to be tachypneic, with respiratory rate in the mid 20s tachycardic with heart rate around 107, hypertensive and hypoxic with O2 sats down to 88 to 89% on room air required 2 L of oxygen to keep O2 sats above 90% -Chest x-ray suggestive of emphysema/COPD as well as possible left sided pneumonia -CTA chest is pending -UA is not suggestive of UTI -Urine tox screen with opiates and THC -COVID-negative -Troponin 3 , repeat troponin 3, , EKG sinus rhythm without acute findings -D-dimer 0.29 BNP 20.0 WBC 12.9 -LFTs are not elevated, creatinine 0.76   Hospital Course:   Brief Narrative:   62 y.o. female smoker with more than 40 pack history, COPD, and history of HTN admitted on 10/18/2023 with acute COPD exacerbation and hypoxia     -Assessment and Plan:  1)acute COPD exacerbation-- -Initial suspicion of sepsis and pneumonia  Not confirmed -Sepsis ruled out --pneumonia ruled out ---COVID-negative -CTA chest without PE or pneumonia --IV Solu-Medrol, mucolytics bronchodilators as ordered -Received Rocephin and doxycycline -Okay to de-escalate to doxycycline monotherapy   2)  acute hypoxic respiratory failure--- due to #1  above -hypoxic with O2 sats down to 88 to 89% on room air required 2 L of oxygen to keep O2 sats above 90% Manage as above #1 -Continue to try to wean off oxygen   3) Atypical CP---- Denies chest pains at rest or with activity, reports consistently reproducible chest pains with coughing and with deep inspiration over the last couple days -Troponin 3 , repeat troponin 3, , EKG sinus rhythm without acute findings -D-dimer 0.29 BNP 20.0 -CTA chest without pulmonary embolism   4)Tobacco abuse-- Smoking  cessation counseling for 4 minutes today,   I have discussed tobacco cessation with the patient.  I have counseled the patient regarding the negative impacts of continued tobacco use including but not limited to lung cancer, COPD, and cardiovascular disease.  I have discussed alternatives to tobacco and modalities that may help facilitate tobacco cessation including but not limited to biofeedback, hypnosis, and medications.  Total time spent with tobacco counseling was 4 minutes -Nicotine patch as prescribed.   Dispo: The patient is from: Home              Anticipated d/c is to: Home     Discharge Condition: ***  Follow UP     Consults obtained - ***  Diet and Activity recommendation:  As advised  Discharge Instructions    **** Discharge Instructions     Call MD for:  difficulty breathing, headache or visual disturbances   Complete by: As directed    Call MD for:  persistant dizziness or light-headedness   Complete by: As directed    Call MD for:  persistant nausea and vomiting   Complete by: As directed    Call MD for:  temperature >100.4   Complete by: As directed    Diet - low sodium heart healthy   Complete by: As directed    Discharge instructions   Complete by: As directed    1) complete abstinence from tobacco advised--- okay to use over-the-counter nicotine patch to help you quit smoking 2) please take medications as prescribed including inhalers-- 3) follow-up with primary care physician of your choice for recheck and reevaluation in 1 to 2 weeks 4)Use GoodRx.com coupons or other store coupons--to get discount on your medications   For home use only DME Nebulizer machine   Complete by: As directed    Patient needs a nebulizer to treat with the following condition: COPD (chronic obstructive pulmonary disease) (HCC)   Length of Need: Lifetime   For home use only DME Nebulizer machine   Complete by: As directed    Patient needs a nebulizer to treat with the  following condition: COPD (chronic obstructive pulmonary disease) (HCC)   Length of Need: Lifetime   Increase activity slowly   Complete by: As directed          Discharge Medications     Allergies as of 10/20/2023       Reactions   Bee Venom Swelling        Medication List     STOP taking these medications    Mucinex Sinus-Max 10-650-400 MG/20ML Liqd Generic drug: Phenylephrine-APAP-guaiFENesin   oxyCODONE-acetaminophen 5-325 MG tablet Commonly known as: PERCOCET/ROXICET       TAKE these medications  acetaminophen 325 MG tablet Commonly known as: TYLENOL Take 2 tablets (650 mg total) by mouth every 6 (six) hours as needed for mild pain (pain score 1-3) (or Fever >/= 101).   albuterol 108 (90 Base) MCG/ACT inhaler Commonly known as: VENTOLIN HFA Inhale 2 puffs into the lungs every 4 (four) hours as needed for shortness of breath or wheezing. What changed: when to take this   albuterol (2.5 MG/3ML) 0.083% nebulizer solution Commonly known as: PROVENTIL Take 3 mLs (2.5 mg total) by nebulization every 4 (four) hours as needed for wheezing or shortness of breath. What changed: You were already taking a medication with the same name, and this prescription was added. Make sure you understand how and when to take each.   azithromycin 500 MG tablet Commonly known as: ZITHROMAX Take 1 tablet (500 mg total) by mouth daily for 5 days.   chlorpheniramine-HYDROcodone 10-8 MG/5ML Commonly known as: TUSSIONEX Take 5-10 mLs by mouth every 12 (twelve) hours as needed for cough.   Compressor/Nebulizer Misc 1 Units by Does not apply route 3 (three) times daily as needed. Nebulizer machine with tubing   dextromethorphan-guaiFENesin 30-600 MG 12hr tablet Commonly known as: MUCINEX DM Take 1 tablet by mouth 2 (two) times daily.   fluticasone-salmeterol 230-21 MCG/ACT inhaler Commonly known as: ADVAIR HFA Inhale 2 puffs into the lungs 2 (two) times daily. For  COPD/Breathing   nicotine 21 mg/24hr patch Commonly known as: NICODERM CQ - dosed in mg/24 hours Place 1 patch (21 mg total) onto the skin daily. Start taking on: October 21, 2023   predniSONE 20 MG tablet Commonly known as: DELTASONE Take 2 tablets (40 mg total) by mouth daily with breakfast for 7 days. For COPD Start taking on: October 21, 2023   traZODone 100 MG tablet Commonly known as: DESYREL Take 1 tablet (100 mg total) by mouth at bedtime. For sleep               Durable Medical Equipment  (From admission, onward)           Start     Ordered   10/20/23 0000  For home use only DME Nebulizer machine       Question Answer Comment  Patient needs a nebulizer to treat with the following condition COPD (chronic obstructive pulmonary disease) (HCC)   Length of Need Lifetime      10/20/23 1220   10/20/23 0000  For home use only DME Nebulizer machine       Question Answer Comment  Patient needs a nebulizer to treat with the following condition COPD (chronic obstructive pulmonary disease) (HCC)   Length of Need Lifetime      10/20/23 1220            Major procedures and Radiology Reports - PLEASE review detailed and final reports for all details, in brief -   ***  CT Angio Chest Pulmonary Embolism (PE) W or WO Contrast  Result Date: 10/18/2023 CLINICAL DATA:  Shortness of breath and cough. Concern for pulmonary embolism. EXAM: CT ANGIOGRAPHY CHEST WITH CONTRAST TECHNIQUE: Multidetector CT imaging of the chest was performed using the standard protocol during bolus administration of intravenous contrast. Multiplanar CT image reconstructions and MIPs were obtained to evaluate the vascular anatomy. RADIATION DOSE REDUCTION: This exam was performed according to the departmental dose-optimization program which includes automated exposure control, adjustment of the mA and/or kV according to patient size and/or use of iterative reconstruction technique. CONTRAST:  75mL  OMNIPAQUE IOHEXOL  350 MG/ML SOLN COMPARISON:  Chest radiograph dated 10/18/2023. FINDINGS: Cardiovascular: There is no cardiomegaly or pericardial effusion. Mild atherosclerotic calcification of the thoracic aorta. No aneurysmal dilatation or dissection. The origins of the great vessels of the aortic arch appear patent. No pulmonary artery embolus identified. Mediastinum/Nodes: No hilar or mediastinal adenopathy. The esophagus is grossly unremarkable. No mediastinal fluid collection. Lungs/Pleura: Minimal bibasilar dependent atelectasis. No consolidative changes. There is no pleural effusion or pneumothorax. The central airways are patent. Upper Abdomen: No acute abnormality. Musculoskeletal: No acute osseous pathology. Bilateral breast implants. The left breast implant is deflated. Review of the MIP images confirms the above findings. IMPRESSION: 1. No acute intrathoracic pathology. No CT evidence of pulmonary artery embolus. 2.  Aortic Atherosclerosis (ICD10-I70.0). Electronically Signed   By: Elgie Collard M.D.   On: 10/18/2023 20:29   DG Chest Portable 1 View  Result Date: 10/18/2023 CLINICAL DATA:  Shortness of breath EXAM: PORTABLE CHEST 1 VIEW COMPARISON:  Chest radiograph dated June 01, 2020. FINDINGS: The heart size and mediastinal contours are within normal limits. Emphysematous changes with mild hyperinflation. Hazy opacity at the left lateral lung base with mild indistinctness of the left costophrenic angle. Streaky right midlung opacities. No pneumothorax. No acute osseous abnormality. IMPRESSION: 1. Hazy opacity at the left lateral lung base with slight indistinctness of the left costophrenic angle could represent a small effusion or infiltrate. Additional streaky opacities at the right midlung could also represent an infiltrate. 2. Emphysema with mild hyperinflation, compatible with COPD. Electronically Signed   By: Hart Robinsons M.D.   On: 10/18/2023 12:12    Micro Results   *** Recent  Results (from the past 240 hour(s))  SARS Coronavirus 2 by RT PCR (hospital order, performed in [facility] hospital lab) *cepheid single result test* Anterior Nasal Swab     Status: None   Collection Time: 10/18/23 11:29 AM   Specimen: Anterior Nasal Swab  Result Value Ref Range Status   SARS Coronavirus 2 by RT PCR NEGATIVE NEGATIVE Final    Comment: (NOTE) SARS-CoV-2 target nucleic acids are NOT DETECTED.  The SARS-CoV-2 RNA is generally detectable in upper and lower respiratory specimens during the acute phase of infection. The lowest concentration of SARS-CoV-2 viral copies this assay can detect is 250 copies / mL. A negative result does not preclude SARS-CoV-2 infection and should not be used as the sole basis for treatment or other patient management decisions.  A negative result may occur with improper specimen collection / handling, submission of specimen other than nasopharyngeal swab, presence of viral mutation(s) within the areas targeted by this assay, and inadequate number of viral copies (<250 copies / mL). A negative result must be combined with clinical observations, patient history, and epidemiological information.  Fact Sheet for Patients:   RoadLapTop.co.za  Fact Sheet for Healthcare Providers: http://kim-miller.com/  This test is not yet approved or  cleared by the [place] FDA and has been authorized for detection and/or diagnosis of SARS-CoV-2 by FDA under an Emergency Use Authorization (EUA).  This EUA will remain in effect (meaning this test can be used) for the duration of the COVID-19 declaration under Section 564(b)(1) of the Act, 21 U.S.C. section 360bbb-3(b)(1), unless the authorization is terminated or revoked sooner.  Performed at [facility], [address] [place], [place] [address]     Today   Subjective    Fayola Meckes today has no ***          Patient has been seen  and examined prior  to discharge   Objective   Blood pressure 134/67, pulse 84, temperature 98.1 F (36.7 C), resp. rate 20, height 5\' 1"  (1.549 m), weight 64 kg, SpO2 95%.   Intake/Output Summary (Last 24 hours) at 10/20/2023 1224 Last data filed at 10/20/2023 0459 Gross per 24 hour  Intake 720 ml  Output --  Net 720 ml    Exam Gen:- Awake Alert, no acute distress *** HEENT:- [place].[email or web address], No sclera icterus Neck-Supple Neck,No JVD,.  Lungs-  CTAB , good air movement bilaterally CV- S1, S2 normal, regular Abd-  +ve B.Sounds, Abd Soft, No tenderness,    Extremity/Skin:- No  edema,   good pulses Psych-affect is appropriate, oriented x3 Neuro-no new focal deficits, no tremors ***   Data Review   CBC w Diff:  Lab Results  Component Value Date   WBC 25.5 (H) 10/19/2023   HGB 13.5 10/19/2023   HCT 41.8 10/19/2023   PLT 364 10/19/2023   LYMPHOPCT 16 10/18/2023   MONOPCT 7 10/18/2023   EOSPCT 11 10/18/2023   BASOPCT 1 10/18/2023    CMP:  Lab Results  Component Value Date   NA 135 10/19/2023   K 3.8 10/19/2023   CL 103 10/19/2023   CO2 24 10/19/2023   BUN 14 10/19/2023   CREATININE 0.77 10/19/2023   PROT 7.5 10/18/2023   ALBUMIN 4.0 10/18/2023   BILITOT 0.5 10/18/2023   ALKPHOS 71 10/18/2023   AST 20 10/18/2023   ALT 19 10/18/2023  .  Total Discharge time is about 33 minutes  Shon Hale M.D on 10/20/2023 at 12:24 PM  Go to [email or web address] -  for contact info  [facility] - Office  [phone]

## 2023-10-20 NOTE — Progress Notes (Signed)
Rx sent to [facility]

## 2023-10-20 NOTE — Discharge Instructions (Signed)
1) complete abstinence from tobacco advised--- okay to use over-the-counter nicotine patch to help you quit smoking 2) please take medications as prescribed including inhalers-- 3) follow-up with primary care physician of your choice for recheck and reevaluation in 1 to 2 weeks 4)Use GoodRx.com coupons or other store coupons--to get discount on your medications

## 2023-10-20 NOTE — Progress Notes (Signed)
Patient awakened with coughing, wheezing. Gave extra albuterol over 15 minute period to decrease work of breathing and wheezing. Coughing seems to exacerbate upper airway wheezes.

## 2023-10-20 NOTE — Plan of Care (Signed)

## 2023-10-20 NOTE — Progress Notes (Signed)
SATURATION QUALIFICATIONS: Patient Saturations on Room Air at Rest = 98%  Patient Saturations on Room Air while Ambulating = 91-93%   No need for home oxygen, notified MD.  Pt walked approx. 150 feet.

## 2023-11-10 ENCOUNTER — Emergency Department (HOSPITAL_COMMUNITY): Payer: Self-pay

## 2023-11-10 ENCOUNTER — Encounter (HOSPITAL_COMMUNITY): Payer: Self-pay | Admitting: *Deleted

## 2023-11-10 ENCOUNTER — Emergency Department (HOSPITAL_COMMUNITY)
Admission: EM | Admit: 2023-11-10 | Discharge: 2023-11-10 | Disposition: A | Discharge status: Home / Self Care | Payer: Self-pay | Attending: Emergency Medicine | Admitting: Emergency Medicine

## 2023-11-10 ENCOUNTER — Other Ambulatory Visit: Payer: Self-pay

## 2023-11-10 DIAGNOSIS — D72829 Elevated white blood cell count, unspecified: Secondary | ICD-10-CM | POA: Insufficient documentation

## 2023-11-10 DIAGNOSIS — Z7951 Long term (current) use of inhaled steroids: Secondary | ICD-10-CM | POA: Insufficient documentation

## 2023-11-10 DIAGNOSIS — R0789 Other chest pain: Secondary | ICD-10-CM | POA: Insufficient documentation

## 2023-11-10 DIAGNOSIS — Z1152 Encounter for screening for COVID-19: Secondary | ICD-10-CM | POA: Insufficient documentation

## 2023-11-10 DIAGNOSIS — I10 Essential (primary) hypertension: Secondary | ICD-10-CM | POA: Insufficient documentation

## 2023-11-10 DIAGNOSIS — J449 Chronic obstructive pulmonary disease, unspecified: Secondary | ICD-10-CM | POA: Insufficient documentation

## 2023-11-10 HISTORY — DX: Chronic obstructive pulmonary disease, unspecified: J44.9

## 2023-11-10 LAB — CBC WITH DIFFERENTIAL/PLATELET
Abs Immature Granulocytes: 0.03 10*3/uL (ref 0.00–0.07)
Basophils Absolute: 0.1 10*3/uL (ref 0.0–0.1)
Basophils Relative: 1 %
Eosinophils Absolute: 0.7 10*3/uL — ABNORMAL HIGH (ref 0.0–0.5)
Eosinophils Relative: 7 %
HCT: 43.2 % (ref 36.0–46.0)
Hemoglobin: 14 g/dL (ref 12.0–15.0)
Immature Granulocytes: 0 %
Lymphocytes Relative: 22 %
Lymphs Abs: 2.3 10*3/uL (ref 0.7–4.0)
MCH: 29.7 pg (ref 26.0–34.0)
MCHC: 32.4 g/dL (ref 30.0–36.0)
MCV: 91.5 fL (ref 80.0–100.0)
Monocytes Absolute: 0.7 10*3/uL (ref 0.1–1.0)
Monocytes Relative: 6 %
Neutro Abs: 6.7 10*3/uL (ref 1.7–7.7)
Neutrophils Relative %: 64 %
Platelets: 326 10*3/uL (ref 150–400)
RBC: 4.72 MIL/uL (ref 3.87–5.11)
RDW: 14.3 % (ref 11.5–15.5)
WBC: 10.6 10*3/uL — ABNORMAL HIGH (ref 4.0–10.5)
nRBC: 0 % (ref 0.0–0.2)

## 2023-11-10 LAB — COMPREHENSIVE METABOLIC PANEL
ALT: 30 U/L (ref 0–44)
AST: 20 U/L (ref 15–41)
Albumin: 3.9 g/dL (ref 3.5–5.0)
Alkaline Phosphatase: 65 U/L (ref 38–126)
Anion gap: 8 (ref 5–15)
BUN: 14 mg/dL (ref 6–20)
CO2: 26 mmol/L (ref 22–32)
Calcium: 9 mg/dL (ref 8.9–10.3)
Chloride: 98 mmol/L (ref 98–111)
Creatinine, Ser: 0.8 mg/dL (ref 0.44–1.00)
GFR, Estimated: >60 mL/min (ref >60)
Glucose, Bld: 94 mg/dL (ref 70–99)
Potassium: 3.6 mmol/L (ref 3.5–5.1)
Sodium: 132 mmol/L — ABNORMAL LOW (ref 135–145)
Total Bilirubin: 0.7 mg/dL (ref <1.2)
Total Protein: 7.1 g/dL (ref 6.5–8.1)

## 2023-11-10 LAB — RESP PANEL BY RT-PCR (RSV, FLU A&B, COVID)  RVPGX2
Influenza A by PCR: NEGATIVE
Influenza B by PCR: NEGATIVE
Resp Syncytial Virus by PCR: NEGATIVE
SARS Coronavirus 2 by RT PCR: NEGATIVE

## 2023-11-10 LAB — BLOOD GAS, VENOUS
Acid-Base Excess: 4.4 mmol/L — ABNORMAL HIGH (ref 0.0–2.0)
Bicarbonate: 30.8 mmol/L — ABNORMAL HIGH (ref 20.0–28.0)
Drawn by: 6892
O2 Saturation: 39.7 %
Patient temperature: 36.7
pCO2, Ven: 51 mm[Hg] (ref 44–60)
pH, Ven: 7.38 (ref 7.25–7.43)
pO2, Ven: <31 mm[Hg] — CL (ref 32–45)

## 2023-11-10 LAB — TROPONIN I (HIGH SENSITIVITY)
Troponin I (High Sensitivity): 2 ng/L (ref <18)
Troponin I (High Sensitivity): 3 ng/L (ref <18)

## 2023-11-10 LAB — MAGNESIUM: Magnesium: 1.9 mg/dL (ref 1.7–2.4)

## 2023-11-10 MED ORDER — MORPHINE SULFATE (PF) 4 MG/ML IV SOLN
4.0000 mg | Freq: Once | INTRAVENOUS | Status: AC
  Administered 2023-11-10: 4 mg via INTRAVENOUS
  Filled 2023-11-10: qty 1

## 2023-11-10 MED ORDER — ONDANSETRON HCL 4 MG/2ML IJ SOLN
4.0000 mg | Freq: Once | INTRAMUSCULAR | Status: AC
  Administered 2023-11-10: 4 mg via INTRAVENOUS
  Filled 2023-11-10: qty 2

## 2023-11-10 MED ORDER — LIDOCAINE 5 % EX PTCH
1.0000 | MEDICATED_PATCH | CUTANEOUS | Status: DC
  Administered 2023-11-10: 1 via TRANSDERMAL
  Filled 2023-11-10: qty 1

## 2023-11-10 MED ORDER — IPRATROPIUM-ALBUTEROL 0.5-2.5 (3) MG/3ML IN SOLN
3.0000 mL | Freq: Once | RESPIRATORY_TRACT | Status: AC
Start: 2023-11-10 — End: 2023-11-10
  Administered 2023-11-10: 3 mL via RESPIRATORY_TRACT
  Filled 2023-11-10: qty 3

## 2023-11-10 MED ORDER — OXYCODONE-ACETAMINOPHEN 5-325 MG PO TABS: 1.0000 | ORAL_TABLET | Freq: Three times a day (TID) | ORAL | 0 refills | Status: AC | PRN

## 2023-11-10 MED ORDER — METHOCARBAMOL 500 MG PO TABS
1000.0000 mg | ORAL_TABLET | Freq: Once | ORAL | Status: AC
Start: 2023-11-10 — End: 2023-11-10
  Administered 2023-11-10: 1000 mg via ORAL
  Filled 2023-11-10: qty 2

## 2023-11-10 MED ORDER — OXYCODONE-ACETAMINOPHEN 5-325 MG PO TABS
1.0000 | ORAL_TABLET | Freq: Once | ORAL | Status: AC
  Administered 2023-11-10: 1 via ORAL
  Filled 2023-11-10: qty 1

## 2023-11-10 MED ORDER — IOHEXOL 350 MG/ML SOLN
75.0000 mL | Freq: Once | INTRAVENOUS | Status: AC | PRN
  Administered 2023-11-10: 75 mL via INTRAVENOUS

## 2023-11-10 MED ORDER — MELOXICAM 7.5 MG PO TABS: 7.5000 mg | ORAL_TABLET | Freq: Every day | ORAL | 0 refills | Status: AC

## 2023-11-10 MED ORDER — LIDOCAINE 5 % EX PTCH: 1.0000 | MEDICATED_PATCH | CUTANEOUS | 0 refills | Status: AC

## 2023-11-10 MED ORDER — KETOROLAC TROMETHAMINE 60 MG/2ML IM SOLN
30.0000 mg | Freq: Once | INTRAMUSCULAR | Status: AC
  Administered 2023-11-10: 30 mg via INTRAMUSCULAR
  Filled 2023-11-10: qty 2

## 2023-11-10 MED ORDER — METHOCARBAMOL 500 MG PO TABS: 1000.0000 mg | ORAL_TABLET | Freq: Three times a day (TID) | ORAL | 0 refills | Status: DC | PRN

## 2023-11-10 NOTE — ED Triage Notes (Signed)
Pt with left rib pain since leaning over checking the oil in her truck, when she stood back up, something caught her in the left side of rib.  Pt with hx of COPD.  Pain with deep breath.

## 2023-11-10 NOTE — ED Notes (Signed)
Patient back from CT.

## 2023-11-10 NOTE — ED Notes (Signed)
Patient transported to CT 

## 2023-11-10 NOTE — ED Provider Notes (Signed)
[place] EMERGENCY DEPARTMENT AT [facility] Provider Note   CSN: [id number] Arrival date & time: 11/10/23  1246     History  Chief Complaint  Patient presents with   Chest Pain    Stephanie Nielsen is a 62 y.o. female.   Chest Pain Patient presents for left lower rib pain.  Onset was 6 days ago.  At the time, she was on a ladder reaching over the fender of her vehicle to check her oil.  As she stood up, she felt a popping sensation on lower anterolateral left rib.  She has since had pain in the area.  Pain is worsened with movement, palpation, and deep inspiration.  She did not take anything for pain throughout the week.  She did take a 5 mg Percocet this morning.  She continues to have significant pain to the area.  Patient denies any recent shortness of breath.  She was admitted for COPD exacerbation earlier this month.  When she left the hospital, she was doing every 6 hour breathing treatments.  Lately, she has been only using them as needed and has not used any lately.     Home Medications Prior to Admission medications   Medication Sig Start Date End Date Taking? Authorizing Provider  albuterol (PROVENTIL) (2.5 MG/3ML) 0.083% nebulizer solution Take 3 mLs (2.5 mg total) by nebulization every 4 (four) hours as needed for wheezing or shortness of breath. 10/20/23 10/19/24 Yes Emokpae, Courage, MD  albuterol (VENTOLIN HFA) 108 (90 Base) MCG/ACT inhaler Inhale 2 puffs into the lungs every 4 (four) hours as needed for shortness of breath or wheezing. 10/20/23  Yes Emokpae, Courage, MD  chlorpheniramine-HYDROcodone (TUSSIONEX) 10-8 MG/5ML Take 5-10 mLs by mouth every 12 (twelve) hours as needed for cough. 10/20/23  Yes Emokpae, Courage, MD  fluticasone-salmeterol (ADVAIR) 250-50 MCG/ACT AEPB Inhale 1 puff into the lungs in the morning and at bedtime. For COPD Patient taking differently: Inhale 1 puff into the lungs daily. 10/20/23  Yes Emokpae, Courage, MD  lidocaine (LIDODERM) 5  % Place 1 patch onto the skin daily. Remove & Discard patch within 12 hours or as directed by MD 11/10/23  Yes Gloris Manchester, MD  meloxicam (MOBIC) 7.5 MG tablet Take 1 tablet (7.5 mg total) by mouth daily for 7 days. 11/10/23 11/17/23 Yes Gloris Manchester, MD  methocarbamol (ROBAXIN) 500 MG tablet Take 2 tablets (1,000 mg total) by mouth every 8 (eight) hours as needed for muscle spasms. 11/10/23  Yes Gloris Manchester, MD  oxyCODONE-Acetaminophen (PERCOCET PO) Take 0.5 tablets by mouth daily as needed (pain).   Yes [provider]  oxyCODONE-acetaminophen (PERCOCET/ROXICET) 5-325 MG tablet Take 1 tablet by mouth every 8 (eight) hours as needed for up to 3 days for severe pain (pain score 7-10). 11/10/23 11/13/23 Yes Gloris Manchester, MD  traZODone (DESYREL) 100 MG tablet Take 1 tablet (100 mg total) by mouth at bedtime. For sleep Patient taking differently: Take 100 mg by mouth at bedtime as needed for sleep. 10/20/23  Yes Emokpae, Courage, MD  dextromethorphan-guaiFENesin (MUCINEX DM) 30-600 MG 12hr tablet Take 1 tablet by mouth 2 (two) times daily. Patient not taking: Reported on 11/10/2023 10/20/23   Shon Hale, MD  Nebulizers (COMPRESSOR/NEBULIZER) MISC 1 Units by Does not apply route 3 (three) times daily as needed. Nebulizer machine with tubing 10/20/23   Emokpae, Courage, MD  nicotine (NICODERM CQ - DOSED IN MG/24 HOURS) 21 mg/24hr patch Place 1 patch (21 mg total) onto the skin daily. Patient not  taking: Reported on 11/10/2023 10/21/23   Shon Hale, MD      Allergies    Bee venom    Review of Systems   Review of Systems  Cardiovascular:  Positive for chest pain.  All other systems reviewed and are negative.   Physical Exam Updated Vital Signs BP 137/75   Pulse 73   Temp 98.1 F (36.7 C)   Resp 20   Ht 5\' 1"  (1.549 m)   Wt 59 kg   SpO2 96%   BMI 24.56 kg/m  Physical Exam Vitals and nursing note reviewed.  Constitutional:      General: She is not in acute distress.     Appearance: She is well-developed. She is not ill-appearing, toxic-appearing or diaphoretic.  HENT:     Head: Normocephalic and atraumatic.  Eyes:     Conjunctiva/sclera: Conjunctivae normal.  Cardiovascular:     Rate and Rhythm: Normal rate and regular rhythm.     Heart sounds: No murmur heard. Pulmonary:     Effort: Pulmonary effort is normal. No respiratory distress.     Breath sounds: Decreased breath sounds present. No wheezing, rhonchi or rales.     Comments: Shallow respirations Chest:     Chest wall: Tenderness present.  Abdominal:     Palpations: Abdomen is soft.     Tenderness: There is no abdominal tenderness.  Musculoskeletal:        General: No swelling. Normal range of motion.     Cervical back: Normal range of motion and neck supple.  Skin:    General: Skin is warm and dry.  Neurological:     General: No focal deficit present.     Mental Status: She is alert and oriented to person, place, and time.  Psychiatric:        Mood and Affect: Mood normal.        Behavior: Behavior normal.     ED Results / Procedures / Treatments   Labs (all labs ordered are listed, but only abnormal results are displayed) Labs Reviewed  COMPREHENSIVE METABOLIC PANEL - Abnormal; Notable for the following components:      Result Value   Sodium 132 (*)    All other components within normal limits  CBC WITH DIFFERENTIAL/PLATELET - Abnormal; Notable for the following components:   WBC 10.6 (*)    Eosinophils Absolute 0.7 (*)    All other components within normal limits  BLOOD GAS, VENOUS - Abnormal; Notable for the following components:   pO2, Ven <31 (*)    Bicarbonate 30.8 (*)    Acid-Base Excess 4.4 (*)    All other components within normal limits  RESP PANEL BY RT-PCR (RSV, FLU A&B, COVID)  RVPGX2  MAGNESIUM  TROPONIN I (HIGH SENSITIVITY)  TROPONIN I (HIGH SENSITIVITY)    EKG EKG Interpretation Date/Time:  Sunday November 10 2023 13:00:03 EST Ventricular Rate:  81 PR  Interval:  164 QRS Duration:  103 QT Interval:  382 QTC Calculation: 444 R Axis:   57  Text Interpretation: Sinus rhythm Abnormal R-wave progression, early transition Confirmed by Gloris Manchester [phone]) on 11/10/2023 1:48:47 PM  Radiology DG Ribs Unilateral W/Chest Left  Result Date: 11/10/2023 CLINICAL DATA:  Left rib pain. EXAM: LEFT RIBS AND CHEST - 3+ VIEW COMPARISON:  Chest x-ray 06/01/2020 FINDINGS: The lungs are clear without focal pneumonia, edema, pneumothorax or pleural effusion. The cardiopericardial silhouette is within normal limits for size. No acute bony abnormality. Telemetry leads overlie the chest. Oblique views of  the left ribs show no evidence for an acute displaced left-sided rib fracture. IMPRESSION: Negative. Electronically Signed   By: Kennith Center M.D.   On: 11/10/2023 15:03    Procedures Procedures    Medications Ordered in ED Medications  lidocaine (LIDODERM) 5 % 1 patch (1 patch Transdermal Patch Applied 11/10/23 1332)  ipratropium-albuterol (DUONEB) 0.5-2.5 (3) MG/3ML nebulizer solution 3 mL (3 mLs Nebulization Given 11/10/23 1328)  ketorolac (TORADOL) injection 30 mg (30 mg Intramuscular Given 11/10/23 1328)  oxyCODONE-acetaminophen (PERCOCET/ROXICET) 5-325 MG per tablet 1 tablet (1 tablet Oral Given 11/10/23 1329)  methocarbamol (ROBAXIN) tablet 1,000 mg (1,000 mg Oral Given 11/10/23 1329)  iohexol (OMNIPAQUE) 350 MG/ML injection 75 mL (75 mLs Intravenous Contrast Given 11/10/23 1524)    ED Course/ Medical Decision Making/ A&P                                 Medical Decision Making Amount and/or Complexity of Data Reviewed Labs: ordered. Radiology: ordered.  Risk Prescription drug management.   This patient presents to the ED for concern of chest wall pain, this involves an extensive number of treatment options, and is a complaint that carries with it a high risk of complications and morbidity.  The differential diagnosis includes rib fracture,  intercostal injury, PE, pneumonia, ACS   Co morbidities that complicate the patient evaluation  COPD, HTN   Additional history obtained:  Additional history obtained from N/A External records from outside source obtained and reviewed including EMR   Lab Tests:  I Ordered, and personally interpreted labs.  The pertinent results include: Mild leukocytosis, normal hemoglobin, normal kidney function, normal electrolytes, normal troponin   Imaging Studies ordered:  I ordered imaging studies including left rib x-ray, CTA chest I independently visualized and interpreted imaging which showed no acute findings on x-ray; CTA pending at time of signout I agree with the radiologist interpretation   Cardiac Monitoring: / EKG:  The patient was maintained on a cardiac monitor.  I personally viewed and interpreted the cardiac monitored which showed an underlying rhythm of: Sinus rhythm   Problem List / ED Course / Critical interventions / Medication management  Patient presenting for left lower anterolateral rib pain for the past 6 days.  This followed a movement/straining injury during which she felt a pop.  On arrival in the ED, vital signs are notable for hypertension.  SpO2 is normal on room air.  Her current breathing is unlabored but she does have shallow respirations.  This is likely secondary to her pleuritic pain in the area.  She has diminished breath sounds this may be just due to her shallow breathing.  Given her history of COPD, DuoNeb was ordered.  Patient was ordered multimodal pain control.  Workup was initiated.  Initial workup, including lab work and left-sided chest x-ray, were unremarkable.  CTA was ordered to evaluate for possible pneumonia or infarction.  Patient did have improved symptoms following multimodal pain control while in the ED.  She was prescribed ongoing multimodal pain control for home.  Results of CTA were pending at time of signout.  Care of patient was signed  out to oncoming ED provider. I ordered medication including lidocaine patch, Toradol, Percocet, Robaxin for analgesia; DuoNeb for COPD Reevaluation of the patient after these medicines showed that the patient improved I have reviewed the patients home medicines and have made adjustments as needed   Social Determinants of Health:  Lives independently        Final Clinical Impression(s) / ED Diagnoses Final diagnoses:  Chest wall pain    Rx / DC Orders ED Discharge Orders          Ordered    lidocaine (LIDODERM) 5 %  Every 24 hours        11/10/23 1621    methocarbamol (ROBAXIN) 500 MG tablet  Every 8 hours PRN        11/10/23 1621    oxyCODONE-acetaminophen (PERCOCET/ROXICET) 5-325 MG tablet  Every 8 hours PRN        11/10/23 1621    meloxicam (MOBIC) 7.5 MG tablet  Daily        11/10/23 1621              Gloris Manchester, MD 11/10/23 1624

## 2023-11-10 NOTE — Discharge Instructions (Addendum)
Prescriptions for pain medications were sent to your [facility] in [place].  Take as needed.  Return to the emergency department for any new or worsening symptoms of concern.

## 2023-11-11 NOTE — ED Provider Notes (Signed)
  Physical Exam  BP 130/70   Pulse 72   Temp 98.3 F (36.8 C) (Oral)   Resp 14   Ht 5\' 1"  (1.549 m)   Wt 59 kg   SpO2 98%   BMI 24.56 kg/m   Physical Exam Vitals and nursing note reviewed.  Constitutional:      General: She is not in acute distress.    Appearance: She is well-developed.  HENT:     Head: Normocephalic and atraumatic.  Eyes:     Conjunctiva/sclera: Conjunctivae normal.  Cardiovascular:     Rate and Rhythm: Normal rate and regular rhythm.     Heart sounds: No murmur heard. Pulmonary:     Effort: Pulmonary effort is normal. No respiratory distress.     Breath sounds: Normal breath sounds.  Chest:     Chest wall: Tenderness present.  Abdominal:     Palpations: Abdomen is soft.     Tenderness: There is no abdominal tenderness.  Musculoskeletal:        General: No swelling.     Cervical back: Neck supple.  Skin:    General: Skin is warm and dry.     Capillary Refill: Capillary refill takes less than 2 seconds.  Neurological:     Mental Status: She is alert.  Psychiatric:        Mood and Affect: Mood normal.     Procedures  Procedures  ED Course / MDM    Medical Decision Making Amount and/or Complexity of Data Reviewed Labs: ordered. Radiology: ordered.  Risk Prescription drug management.   Patient received in handoff.  Chest pain with overall negative cardiac workup.  Pending PE study.  Plan is for discharge home if PE study negative.  PE study reassuringly negative.  Patient pain controlled and discharged.  Symptoms consistent with costochondritis.  Return precautions given of which she voiced understanding.       Glendora Score, MD 11/11/23 1106

## 2024-04-28 ENCOUNTER — Other Ambulatory Visit: Payer: Self-pay

## 2024-04-28 ENCOUNTER — Emergency Department (HOSPITAL_COMMUNITY): Payer: Self-pay

## 2024-04-28 ENCOUNTER — Emergency Department (HOSPITAL_COMMUNITY)
Admission: EM | Admit: 2024-04-28 | Discharge: 2024-04-28 | Disposition: A | Discharge status: Home / Self Care | Payer: Self-pay | Attending: Emergency Medicine | Admitting: Emergency Medicine

## 2024-04-28 ENCOUNTER — Encounter (HOSPITAL_COMMUNITY): Payer: Self-pay

## 2024-04-28 DIAGNOSIS — M542 Cervicalgia: Secondary | ICD-10-CM | POA: Insufficient documentation

## 2024-04-28 DIAGNOSIS — J449 Chronic obstructive pulmonary disease, unspecified: Secondary | ICD-10-CM | POA: Insufficient documentation

## 2024-04-28 DIAGNOSIS — Z7951 Long term (current) use of inhaled steroids: Secondary | ICD-10-CM | POA: Insufficient documentation

## 2024-04-28 DIAGNOSIS — I10 Essential (primary) hypertension: Secondary | ICD-10-CM | POA: Insufficient documentation

## 2024-04-28 DIAGNOSIS — M25511 Pain in right shoulder: Secondary | ICD-10-CM | POA: Insufficient documentation

## 2024-04-28 MED ORDER — METHOCARBAMOL 500 MG PO TABS
500.0000 mg | ORAL_TABLET | Freq: Once | ORAL | Status: AC
  Administered 2024-04-28: 500 mg via ORAL
  Filled 2024-04-28: qty 1

## 2024-04-28 MED ORDER — OXYCODONE-ACETAMINOPHEN 5-325 MG PO TABS: 1.0000 | ORAL_TABLET | ORAL | 0 refills | Status: AC | PRN

## 2024-04-28 MED ORDER — OXYCODONE-ACETAMINOPHEN 5-325 MG PO TABS
1.0000 | ORAL_TABLET | Freq: Once | ORAL | Status: AC
  Administered 2024-04-28: 1 via ORAL
  Filled 2024-04-28: qty 1

## 2024-04-28 MED ORDER — METHOCARBAMOL 500 MG PO TABS: 500.0000 mg | ORAL_TABLET | Freq: Three times a day (TID) | ORAL | 0 refills | Status: AC

## 2024-04-28 NOTE — ED Provider Notes (Signed)
 [place] EMERGENCY DEPARTMENT AT [facility] Provider Note   CSN: [id number] Arrival date & time: 04/28/24  1238     History {Add pertinent medical, surgical, social history, OB history to HPI:1} Chief Complaint  Patient presents with  . Shoulder Pain    Stephanie Nielsen is a 63 y.o. female.   Shoulder Pain Associated symptoms: no fever        Stephanie Nielsen is a 63 y.o. female who presents to the Emergency Department complaining of     Home Medications Prior to Admission medications   Medication Sig Start Date End Date Taking? Authorizing Provider  albuterol  (PROVENTIL ) (2.5 MG/3ML) 0.083% nebulizer solution Take 3 mLs (2.5 mg total) by nebulization every 4 (four) hours as needed for wheezing or shortness of breath. 10/20/23 10/19/24  Colin Dawley, MD  albuterol  (VENTOLIN  HFA) 108 (90 Base) MCG/ACT inhaler Inhale 2 puffs into the lungs every 4 (four) hours as needed for shortness of breath or wheezing. 10/20/23   Colin Dawley, MD  chlorpheniramine-HYDROcodone  (TUSSIONEX) 10-8 MG/5ML Take 5-10 mLs by mouth every 12 (twelve) hours as needed for cough. 10/20/23   Colin Dawley, MD  dextromethorphan -guaiFENesin  (MUCINEX  DM) 30-600 MG 12hr tablet Take 1 tablet by mouth 2 (two) times daily. Patient not taking: Reported on 11/10/2023 10/20/23   Colin Dawley, MD  fluticasone -salmeterol (ADVAIR) 250-50 MCG/ACT AEPB Inhale 1 puff into the lungs in the morning and at bedtime. For COPD Patient taking differently: Inhale 1 puff into the lungs daily. 10/20/23   Colin Dawley, MD  lidocaine  (LIDODERM ) 5 % Place 1 patch onto the skin daily. Remove & Discard patch within 12 hours or as directed by MD 11/10/23   Iva Mariner, MD  methocarbamol  (ROBAXIN ) 500 MG tablet Take 2 tablets (1,000 mg total) by mouth every 8 (eight) hours as needed for muscle spasms. 11/10/23   Iva Mariner, MD  Nebulizers (COMPRESSOR/NEBULIZER) MISC 1 Units by Does not apply route 3 (three) times  daily as needed. Nebulizer machine with tubing 10/20/23   Emokpae, Courage, MD  nicotine  (NICODERM CQ  - DOSED IN MG/24 HOURS) 21 mg/24hr patch Place 1 patch (21 mg total) onto the skin daily. Patient not taking: Reported on 11/10/2023 10/21/23   Colin Dawley, MD  oxyCODONE -Acetaminophen  (PERCOCET PO) Take 0.5 tablets by mouth daily as needed (pain).    [provider]  traZODone  (DESYREL ) 100 MG tablet Take 1 tablet (100 mg total) by mouth at bedtime. For sleep Patient taking differently: Take 100 mg by mouth at bedtime as needed for sleep. 10/20/23   Colin Dawley, MD      Allergies    Bee venom    Review of Systems   Review of Systems  Constitutional:  Negative for chills and fever.  Respiratory:  Negative for shortness of breath.   Cardiovascular:  Negative for chest pain.  Gastrointestinal:  Negative for abdominal pain, diarrhea, nausea and vomiting.  Musculoskeletal:  Positive for arthralgias (left shoulder pain).  Skin:  Negative for rash.  Neurological:  Negative for dizziness, weakness, numbness and headaches.    Physical Exam Updated Vital Signs BP (!) 147/92 (BP Location: Left Arm)   Pulse 73   Temp 98.3 F (36.8 C) (Oral)   Resp 18   Ht 5\' 1"  (1.549 m)   Wt 59 kg   SpO2 97%   BMI 24.58 kg/m  Physical Exam Vitals and nursing note reviewed.    ED Results / Procedures / Treatments   Labs (all labs ordered are listed,  but only abnormal results are displayed) Labs Reviewed - No data to display  EKG None  Radiology No results found.  Procedures Procedures  {Document cardiac monitor, telemetry assessment procedure when appropriate:1}  Medications Ordered in ED Medications - No data to display  ED Course/ Medical Decision Making/ A&P   {   Click here for ABCD2, HEART and other calculatorsREFRESH Note before signing :1}                              Medical Decision Making Amount and/or Complexity of Data Reviewed Radiology:  ordered.   ***  {Document critical care time when appropriate:1} {Document review of labs and clinical decision tools ie heart score, Chads2Vasc2 etc:1}  {Document your independent review of radiology images, and any outside records:1} {Document your discussion with family members, caretakers, and with consultants:1} {Document social determinants of health affecting pt's care:1} {Document your decision making why or why not admission, treatments were needed:1} Final Clinical Impression(s) / ED Diagnoses Final diagnoses:  None    Rx / DC Orders ED Discharge Orders     None

## 2024-04-28 NOTE — ED Triage Notes (Signed)
 Pt c/o rt shoulder pain. Pt states she was moving a big plastic barrel yesterday and feels cracking and popping in her arm. Pt states if she lowers her arm is throbs and pain radiates up her neck. Pt states she took a 10 mg percocet last night and 5 mg later on and was able to go to sleep but woke up in severe pain this morning.

## 2024-04-28 NOTE — Discharge Instructions (Signed)
 You may alternate ice and heat to your shoulder.  Try moving your arm several times throughout the day.  You may wear the sling only as needed for comfort but do not wear continuously.  As discussed, take ibuprofen, 600 mg 3 times a day with food.  Call the orthopedic provider listed to arrange follow-up appointment.
